# Patient Record
Sex: Female | Born: 1970 | Race: White | Hispanic: No | Marital: Married | Smoking: Former smoker
Health system: Southern US, Community
[De-identification: ages and names within clinical notes are randomized; demographics above are authoritative.]

## PROBLEM LIST (undated history)

## (undated) DIAGNOSIS — G43909 Migraine, unspecified, not intractable, without status migrainosus: Secondary | ICD-10-CM

## (undated) DIAGNOSIS — F419 Anxiety disorder, unspecified: Secondary | ICD-10-CM

## (undated) DIAGNOSIS — Z973 Presence of spectacles and contact lenses: Secondary | ICD-10-CM

## (undated) DIAGNOSIS — N941 Unspecified dyspareunia: Secondary | ICD-10-CM

## (undated) DIAGNOSIS — E8940 Asymptomatic postprocedural ovarian failure: Secondary | ICD-10-CM

## (undated) DIAGNOSIS — F32A Depression, unspecified: Secondary | ICD-10-CM

## (undated) DIAGNOSIS — N809 Endometriosis, unspecified: Secondary | ICD-10-CM

## (undated) DIAGNOSIS — L989 Disorder of the skin and subcutaneous tissue, unspecified: Secondary | ICD-10-CM

## (undated) DIAGNOSIS — IMO0002 Reserved for concepts with insufficient information to code with codable children: Secondary | ICD-10-CM

## (undated) DIAGNOSIS — N879 Dysplasia of cervix uteri, unspecified: Secondary | ICD-10-CM

## (undated) DIAGNOSIS — F329 Major depressive disorder, single episode, unspecified: Secondary | ICD-10-CM

## (undated) HISTORY — PX: CRYOTHERAPY: SHX1416

## (undated) HISTORY — DX: Asymptomatic postprocedural ovarian failure: E89.40

## (undated) HISTORY — DX: Unspecified dyspareunia: N94.10

## (undated) HISTORY — DX: Migraine, unspecified, not intractable, without status migrainosus: G43.909

## (undated) HISTORY — DX: Dysplasia of cervix uteri, unspecified: N87.9

## (undated) HISTORY — DX: Anxiety disorder, unspecified: F41.9

## (undated) HISTORY — DX: Disorder of the skin and subcutaneous tissue, unspecified: L98.9

## (undated) HISTORY — DX: Endometriosis, unspecified: N80.9

## (undated) HISTORY — DX: Depression, unspecified: F32.A

## (undated) HISTORY — DX: Reserved for concepts with insufficient information to code with codable children: IMO0002

---
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
## 2009-10-26 HISTORY — PX: VAGINAL HYSTERECTOMY: SUR661

## 2011-07-13 ENCOUNTER — Ambulatory Visit: Payer: Self-pay | Admitting: Obstetrics and Gynecology

## 2011-11-08 ENCOUNTER — Ambulatory Visit: Payer: Self-pay | Admitting: Obstetrics and Gynecology

## 2011-11-08 LAB — CBC
HCT: 41.7 % (ref 35.0–47.0)
HGB: 14 g/dL (ref 12.0–16.0)
MCH: 31.2 pg (ref 26.0–34.0)
MCV: 93 fL (ref 80–100)
Platelet: 198 10*3/uL (ref 150–440)
RBC: 4.48 10*6/uL (ref 3.80–5.20)
RDW: 12.8 % (ref 11.5–14.5)
WBC: 6.1 10*3/uL (ref 3.6–11.0)

## 2011-11-08 LAB — BASIC METABOLIC PANEL
BUN: 8 mg/dL (ref 7–18)
Calcium, Total: 8.6 mg/dL (ref 8.5–10.1)
Chloride: 107 mmol/L (ref 98–107)
Co2: 27 mmol/L (ref 21–32)
EGFR (African American): >60
Potassium: 3.9 mmol/L (ref 3.5–5.1)
Sodium: 139 mmol/L (ref 136–145)

## 2011-11-13 ENCOUNTER — Ambulatory Visit: Payer: Self-pay | Admitting: Obstetrics and Gynecology

## 2011-11-14 LAB — HEMOGLOBIN: HGB: 11.1 g/dL — ABNORMAL LOW (ref 12.0–16.0)

## 2011-11-16 LAB — PATHOLOGY REPORT

## 2013-01-10 ENCOUNTER — Emergency Department: Payer: Self-pay | Admitting: Emergency Medicine

## 2014-03-17 HISTORY — PX: CERVICAL DISCECTOMY: SHX98

## 2014-11-11 LAB — HM PAP SMEAR: HM PAP: NEGATIVE

## 2014-11-26 ENCOUNTER — Ambulatory Visit
Admit: 2014-11-26 | Disposition: A | Discharge status: Home / Self Care | Payer: Self-pay | Attending: Obstetrics and Gynecology | Admitting: Obstetrics and Gynecology

## 2014-11-26 LAB — HM MAMMOGRAPHY

## 2014-12-20 NOTE — Op Note (Signed)
PATIENT NAME:  Carmen Logan, Carmen Logan MR#:  [id number] DATE OF BIRTH:  September 11, 1970  DATE OF PROCEDURE:  11/13/2011  PREOPERATIVE DIAGNOSES:  1. Chronic pelvic pain.  2. Endometriosis.   POSTOPERATIVE DIAGNOSES:  1. Chronic pelvic pain.  2. Endometriosis.  3. Incidental cystotomy.  4. Normal cystoscopy with bilateral ureteral efflux.   OPERATIVE PROCEDURES:  1. Transvaginal hysterectomy with left salpingo-oophorectomy.  2. Repair of cystotomy.  3. Cystoscopy.   SURGEON: Alanda Slim. Gonzalo Waymire, M.D.   FIRST ASSISTANT: None.   ANESTHESIA: General endotracheal.   INDICATIONS: The patient is a 44 year old single, white multiparous female who presents for definitive surgical management of chronic pelvic pain secondary to endometriosis. The patient previously underwent right salpingo-oophorectomy for symptomatic pain. She now desires definitive treatment through a total vaginal hysterectomy with left salpingo-oophorectomy.  FINDINGS AT SURGERY: Grossly normal uterus and left tube and ovary. There was an incidental cystotomy noted on entry into the anterior cul-de-sac. Postoperative cystoscopy did reveal normal ureteral efflux with the ureteral orifices being functional.   DESCRIPTION OF PROCEDURE: The patient was brought to the Operating Room where she was placed in the supine position and general endotracheal anesthesia was induced without difficulty. She was placed in the dorsal lithotomy position using candy-cane stirrups. A Betadine perineal and intravaginal prep and drape was performed in the standard fashion. A Foley catheter was placed and the bladder was draining clear yellow urine. A weighted speculum was placed into the vagina and a double-tooth tenaculum was placed on the anterior lip of the cervix. Posterior colpotomy was made with Mayo scissors. Uterosacral ligaments were clamped, cut, and stick tied using 0 Vicryl. The cervix was circumscribed and the bladder and vaginal mucosa were  dissected off the lower uterine segment through sharp and blunt dissection. Further cardinal broad ligament complexes were clamped, cut, and stick tied. Upon making attempted entry into the anterior cul-de-sac, an incidental cystotomy was made. This was noted and tagged with Vicryl suture until after the hysterectomy in order to facilitate repair. The anterior cul-de-sac was entered. The remaining cardinal broad ligament complexes were clamped, cut, and stick tied up to the level of the utero-ovarian ligaments which were eventually then clamped, cut, and stick tied. The uterus was removed from the operative field. The right tube and ovary were then isolated with a Babcock clamp. A curved Haney clamp was used to crossclamp the infundibulopelvic ligament and the tube and ovary were then excised sharply. A free tie was placed on this pedicle after which a stick tie of 0 Vicryl was placed. Next, the posterior cuff was run using a 0 chromic suture in a running baseball stitch manner. This was done from uterosacral ligament posteriorly to the contralateral uterosacral ligament. The pedicles were noted to be hemostatic. Attention was then turned to the cystotomy. Allis clamps were placed on the bladder mucosa margins. The cystotomy was closed in two layers using a simple running stitch of 3-0 Vicryl. The first stitch incorporated the bladder mucosa and muscularis. The second layer of closure included an imbricating layer involving the muscularis of the bladder. The bladder was then filled with 200 mL of lactate Ringer's to verify integrity of the repair. No leakage was noted. Finally, the peritoneum was reapproximated using 0 Vicryl suture in a pursestring manner. This was followed by closure of the vagina with simple interrupted sutures of 2-0 chromic. Cystoscopy was then performed after Indigo carmine had been given intravenously. Cystoscopy did reveal the ureters to be patent with efflux of indigo  carmine from each  orifice. The repair site in the the bladder was noted and appeared  intact. No abnormalities were appreciated on cystoscopy. At this point, the bladder was drained with replacement of the Foley catheter, and the patient was then awakened, extubated, and taken to the Recovery Room in satisfactory condition. Estimated blood loss was 100 mL. There were no complications. All instruments, needle, and sponge counts were verified as correct.  ____________________________ Alanda Slim. Zahra Peffley, MD mad:slb D: 11/14/2011 13:13:00 ET     T: 11/14/2011 13:31:25 ET       JOB#: [id number] cc: Hassell Done A. Jacory Kamel, MD, <Dictator> Alanda Slim Hendy Brindle MD ELECTRONICALLY SIGNED 11/18/2011 11:44

## 2014-12-20 NOTE — H&P (Signed)
PATIENT NAME:  Carmen Logan, Carmen Logan MR#:  [id number] DATE OF BIRTH:  [birth year]  DATE OF ADMISSION:  11/13/2011  PREOPERATIVE DIAGNOSIS: Symptomatic endometriosis (chronic pelvic pain, midline and left lower quadrant).   HISTORY OF PRESENT ILLNESS: Chaunda Vandergriff is a 44 year old married white female, para 2-0-0-2, last menstrual period uncertain, using Provera 3 mg continuously for treatment of symptomatic endometriosis, presents now for definitive surgery. The patient previously has had laparoscopic RSO for pelvic pain. The patient is desiring definitive treatment at this time for her endometriosis. Major symptoms of endometriosis include chronic deep thrust dyspareunia to a point where she can no longer have relations with her spouse. She also has severe dysmenorrhea with perimenstrual low backache. Laparoscopy did verify endometriosis back in 1994.   PAST MEDICAL HISTORY:  1. Endometriosis.  2. Cervical dysplasia.   PAST SURGICAL HISTORY:  1. Cryosurgery.  2. BTL. 3. Laparoscopic RSO.   PAST OB HISTORY: Para 2-0-0-2. SVD x2 with largest baby being 6 pounds, 9 ounces.   PAST GYN HISTORY: Menarche age 67. Cycles typically every 28 days although more recently prior to Provera therapy they have been every 24 to 28 days. Duration of flow is 5 to 7 days of moderate degree. She typically has midline menstrual cramps, although she does have occasional perimenstrual low backache with radiation into the buttocks and groin.   FAMILY HISTORY: Negative for cancer of the colon, ovary, or breast. There is no known history of endometriosis in her family. There is diabetes mellitus on maternal side. The patient does not know paternal family history.   SOCIAL HISTORY: The patient smokes one-third of a pack of cigarettes a day. She denies alcohol use or drug use. 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   REVIEW OF SYSTEMS: The patient denies recent illness. She denies history of reactive airway disease. She denies  history of coagulopathy.  PHYSICAL EXAMINATION:  VITAL SIGNS: Height 5 feet 2 inches, weight 117, blood pressure 111/75, heart rate 86, BMI 21.   HEENT: Oropharynx clear.   NECK: Supple. No thyromegaly or adenopathy.   LUNGS: Clear.   HEART: Regular rate and rhythm without murmur.   ABDOMEN: Soft, flat, and nontender. Laparoscopy incisions in the umbilical region, right lower quadrant, and suprapubic region are well healed. There is no organomegaly.   PELVIC: External genitalia normal. BUS normal. Vagina has good estrogen effect. Cervix is parous and without lesions. There is 2 out of 4 cervical motion tenderness. Uterus is midplane and of normal size and shape. It is 1 out of 4 tender. Adnexa are nontender and nonpalpable.   RECTAL: Normal sphincter tone. No rectal masses. There was no obvious posterior uterine abnormality.   EXTREMITIES: Without clubbing, cyanosis, or edema.   IMPRESSION: Symptomatic endometriosis.    PLAN: TVH, LSO. Date of surgery is 11/13/2011.   CONSENT NOTE: Carmen Logan is to undergo transvaginal hysterectomy with left salpingo-oophorectomy for symptomatic endometriosis. She understands that this procedure will make her surgically menopausal. She is accepting of all risks which include, but are not limited to, bleeding, infection, pelvic organ injury with need for repair, blood clot disorders, anesthesia risks, and death. The patient does understand that she will likely need postoperative ERT therapy to offset vasomotor symptoms and help prevent osteoporosis. She is understanding of this and accepts. She is ready and willing to proceed with surgery. All questions are answered. Informed consent is given.  ____________________________ Alanda Slim. Sully Manzi, MD mad:drc D: 11/08/2011 16:53:25 ET T: 11/08/2011 17:12:00 ET JOB#: [id number]  cc:  Nikolaus Pienta A. Yocheved Depner, MD, <Dictator> Alanda Slim Dianelly Ferran MD ELECTRONICALLY SIGNED 11/09/2011 13:28

## 2015-05-26 ENCOUNTER — Telehealth: Payer: Self-pay | Admitting: Obstetrics and Gynecology

## 2015-05-26 NOTE — Telephone Encounter (Signed)
Pt called and she stopped taking her covarayx because she was having really bad migraines and headache and it has been a couple of weeks and they have subsided and she wanted to know if there was another medication that she can try for her hormone replacement. She did start taking OTC black cohosh and [place] wort as soon as she stopped taking the covarayx. Pt did say she was trying to send message thru my chart but she was unable to compose a message to [facility].

## 2015-05-26 NOTE — Telephone Encounter (Signed)
Pt aware she will need to make an appt and discuss with mad. Offered to make her an appt- she declines at this time.

## 2015-06-28 ENCOUNTER — Other Ambulatory Visit: Payer: Self-pay | Admitting: Obstetrics and Gynecology

## 2015-08-04 ENCOUNTER — Ambulatory Visit (INDEPENDENT_AMBULATORY_CARE_PROVIDER_SITE_OTHER): Payer: 59 | Admitting: Obstetrics and Gynecology

## 2015-08-04 ENCOUNTER — Encounter: Payer: Self-pay | Admitting: Obstetrics and Gynecology

## 2015-08-04 VITALS — BP 122/74 | HR 84 | Ht 62.0 in | Wt 119.6 lb

## 2015-08-04 DIAGNOSIS — N958 Other specified menopausal and perimenopausal disorders: Secondary | ICD-10-CM | POA: Diagnosis not present

## 2015-08-04 DIAGNOSIS — Z8669 Personal history of other diseases of the nervous system and sense organs: Secondary | ICD-10-CM | POA: Diagnosis not present

## 2015-08-04 DIAGNOSIS — E8940 Asymptomatic postprocedural ovarian failure: Secondary | ICD-10-CM | POA: Insufficient documentation

## 2015-08-04 DIAGNOSIS — Z8742 Personal history of other diseases of the female genital tract: Secondary | ICD-10-CM | POA: Diagnosis not present

## 2015-08-04 DIAGNOSIS — IMO0002 Reserved for concepts with insufficient information to code with codable children: Secondary | ICD-10-CM | POA: Insufficient documentation

## 2015-08-04 DIAGNOSIS — Z90710 Acquired absence of both cervix and uterus: Secondary | ICD-10-CM | POA: Diagnosis not present

## 2015-08-04 MED ORDER — ESTRADIOL 1 MG PO TABS: 1.0000 mg | ORAL_TABLET | Freq: Every day | ORAL | Status: DC

## 2015-08-04 NOTE — Progress Notes (Signed)
Patient ID: Carmen Logan, female   DOB: 1971-01-17, 44 y.o.   MRN: [id number]  6 month f/u hrt  d/c estratest- 2 months ago  causes migrianes Wants back on estradiol if possible  Chief complaint: 1.  Surgical menopause. 2.  Vasomotor symptoms. 3.  Migraine headaches.  Patient has been on [place] (esterified estrogen/testosterone) for vasomotor symptom relief.  She had to discontinue the medication because of severe migraines, not relieved with her migraine medications.  Prior dosing with Estrates tAlternative do not give her the headaches.  However, the patient's insurance formulary will not Provide this medication. Previously, the patient was on estradiol with fair control of vasomotor symptoms; she would like to go back on estradiol.  She is also using Estrace cream intravaginally.  We did discuss the possibility of going to Estring in order to provide benefit for both oral estrogen therapy as well as intravaginal benefits.  She will consider this when she returns for her annual physical in 3 months.  ASSESSMENT: 1.  Migraine headache exacerbation secondary to Covaryx 2.  Surgical menopause, symptomatic.  PLAN: 1.  Estradiol 1 mg daily 2.  Continue Estrace cream intravaginally twice weekly. 3.  Return in 3 months for gynecologic physical. 4.  Patient is to consider Estring as an alternative for her ERT therapy and vaginal estrogen therapy.  A total of 15 minutes were spent face-to-face with the patient during this encounter and over half of that time dealt with counseling and coordination of care.  Brayton Mars, MD Note: This dictation was prepared with Dragon dictation along with smaller phrase technology. Any transcriptional errors that result from this process are unintentional.

## 2015-08-04 NOTE — Patient Instructions (Signed)
1.  Start estradiol 1 mg a day. 2.  Continue Estrace cream intravaginally biweekly. 3.  Return in March 2016 for annual exam

## 2015-10-21 ENCOUNTER — Other Ambulatory Visit: Payer: Self-pay | Admitting: Obstetrics and Gynecology

## 2015-11-16 ENCOUNTER — Encounter: Payer: Self-pay | Admitting: Obstetrics and Gynecology

## 2015-11-16 ENCOUNTER — Ambulatory Visit (INDEPENDENT_AMBULATORY_CARE_PROVIDER_SITE_OTHER): Payer: Managed Care, Other (non HMO) | Admitting: Obstetrics and Gynecology

## 2015-11-16 VITALS — BP 120/70 | HR 74 | Ht 62.0 in | Wt 127.5 lb

## 2015-11-16 DIAGNOSIS — Z8669 Personal history of other diseases of the nervous system and sense organs: Secondary | ICD-10-CM

## 2015-11-16 DIAGNOSIS — E8940 Asymptomatic postprocedural ovarian failure: Secondary | ICD-10-CM

## 2015-11-16 DIAGNOSIS — Z90710 Acquired absence of both cervix and uterus: Secondary | ICD-10-CM

## 2015-11-16 DIAGNOSIS — N958 Other specified menopausal and perimenopausal disorders: Secondary | ICD-10-CM

## 2015-11-16 DIAGNOSIS — R6882 Decreased libido: Secondary | ICD-10-CM | POA: Diagnosis not present

## 2015-11-16 DIAGNOSIS — Z01419 Encounter for gynecological examination (general) (routine) without abnormal findings: Secondary | ICD-10-CM

## 2015-11-16 MED ORDER — ESTRADIOL 1 MG PO TABS: 1.0000 mg | ORAL_TABLET | Freq: Every day | ORAL | Status: DC

## 2015-11-16 MED ORDER — ESTRADIOL 0.1 MG/GM VA CREA: 0.0500 | TOPICAL_CREAM | Freq: Every day | VAGINAL | Status: DC

## 2015-11-16 NOTE — Patient Instructions (Signed)
1. No Pap smear. 2. Mammogram ordered 3. Estradiol 1 mg daily refills 4. Estrace cream twice a week intravaginal recommended 5. Trial of testosterone cream in HRT cream to be applied topically to the mons/clitoris every other day or twice weekly 6. Continue with calcium supplementation 600 mg twice a day along with vitamin D 400 units twice a day 7. Return in 1 year

## 2015-11-16 NOTE — Addendum Note (Signed)
Addended by: Elouise Munroe on: 11/16/2015 09:56 AM   Modules accepted: Orders

## 2015-11-16 NOTE — Progress Notes (Signed)
Patient ID: Carmen Logan, female   DOB: Feb 23, 1971, 45 y.o.   MRN: [id number] ANNUAL PREVENTATIVE CARE GYN  ENCOUNTER NOTE  Subjective:       Carmen Logan is a 45 y.o. G34P1002 female here for a routine annual gynecologic exam.  Current complaints: 1. Low Libido 2. Dyspareunia    45 y/o Female who is surgically menopausal c/o low libido and dyspareunia secondary to vaginal dryness. Pt is currently taking Estradiol Tablets 19m daily and using Vaginal Estradiol Cream 0.1 mg weekly. Pt has had a trial of Estratest but d/c due to increase in migraines. Pt states she experiences the occasional hot flash but otherwise denies vasomotor symptoms.    Gynecologic History No LMP recorded. Patient has had a hysterectomy. Contraception: status post hysterectomy Last Pap: 10/2014 neg-neg. Results were: normal Last mammogram: 10/2014 birad. Results were: normal Currently on HRT. Estradiol Tablets and Cream  No history of osteoporosis or fracture, taking Vitamin D and Calcium Supplementation   Obstetric History OB History  Gravida Para Term Preterm AB SAB TAB Ectopic Multiple Living  2 1 1       2     # Outcome Date GA Lbr Len/2nd Weight Sex Delivery Anes PTL Lv  2 Gravida 1999   6 lb 14.4 oz (3.13 kg) F Vag-Spont   Y  1 Term 1995   6 lb 4.8 oz (2.858 kg) F Vag-Spont   Y      Past Medical History  Diagnosis Date  . HPV test positive   . Surgical menopause   . Migraine   . Endometriosis   . Dyspareunia, female   . Facial skin lesion   . Cervical dysplasia     Past Surgical History  Procedure Laterality Date  . Vaginal hysterectomy      tvh lso  . Cervical discectomy    . Cryotherapy      cervix    Current Outpatient Prescriptions on File Prior to Visit  Medication Sig Dispense Refill  . calcium-vitamin D (OSCAL WITH D) 250-125 MG-UNIT tablet Take 1 tablet by mouth daily.    .Marland Kitchenestradiol (ESTRACE VAGINAL) 0.1 MG/GM vaginal cream Place 1 Applicatorful vaginally at bedtime.    .Marland Kitchen estradiol (ESTRACE) 1 MG tablet Take 1 tablet (1 mg total) by mouth daily. 90 tablet 3  . SUMAtriptan (IMITREX) 50 MG tablet Take 1 tablet by mouth at  onset of headache. May  repeat every 2 hours. No  more than 4 tablets daily. 13 tablet 0   No current facility-administered medications on file prior to visit.    No Known Allergies  Social History   Social History  . Marital Status: Married    Spouse Name: N/A  . Number of Children: N/A  . Years of Education: N/A   Occupational History  . Not on file.   Social History Main Topics  . Smoking status: Former SResearch scientist (life sciences) . Smokeless tobacco: Not on file  . Alcohol Use: Yes     Comment: occas  . Drug Use: No  . Sexual Activity: Yes    Birth Control/ Protection: Surgical   Other Topics Concern  . Not on file   Social History Narrative    Family History  Problem Relation Age of Onset  . Heart disease Mother   . Hypertension Mother   . Heart disease Father   . Cancer Neg Hx   . Diabetes Maternal Aunt   . Diabetes Maternal Grandmother     The following  portions of the patient's history were reviewed and updated as appropriate: allergies, current medications, past family history, past medical history, past social history, past surgical history and problem list.  Review of Systems ROS Review of Systems - General ROS:  negative for - chills, fatigue, fever,  night sweats, weight gain or weight loss Psychological ROS: positive for decreased libido negative for - anxiety, depression, mood swings, physical abuse or sexual abuse Ophthalmic ROS: negative for - blurry vision, eye pain or loss of vision ENT ROS: negative for - headaches, hearing change, visual changes or vocal changes Allergy and Immunology ROS: negative for - hives, itchy/watery eyes or seasonal allergies Hematological and Lymphatic ROS: negative for - bleeding problems, bruising, swollen lymph nodes or weight loss Endocrine ROS: positive for hot flashes (mild)   negative for - galactorrhea, hair pattern changes, malaise/lethargy, mood swings, palpitations, polydipsia/polyuria, skin changes, temperature intolerance or unexpected weight changes Breast ROS: negative for - new or changing breast lumps or nipple discharge Respiratory ROS: negative for - cough or shortness of breath Cardiovascular ROS: negative for - chest pain, irregular heartbeat, palpitations or shortness of breath Gastrointestinal ROS: no abdominal pain, change in bowel habits, or black or bloody stools Genito-Urinary ROS: positive for dryness and dyspanunia no dysuria, trouble voiding, or hematuria Musculoskeletal ROS: negative for - joint pain or joint stiffness Neurological ROS: negative for - bowel and bladder control changes Dermatological ROS: negative for rash and skin lesion changes   Objective:   BP 120/70 mmHg  Pulse 74  Ht 5' 2"  (1.575 m)  Wt 127 lb 8 oz (57.834 kg)  BMI 23.31 kg/m2 CONSTITUTIONAL: Well-developed, well-nourished female in no acute distress.  PSYCHIATRIC: Normal mood and affect. Normal behavior. Normal judgment and thought content. [place]: Alert and oriented to person, place, and time. Normal muscle tone coordination. No cranial nerve deficit noted. HENT:  Normocephalic, atraumatic, External right and left ear normal. Oropharynx is clear and moist EYES: Conjunctivae and EOM are normal. Pupils are equal, round, and reactive to light. No scleral icterus.  NECK: Normal range of motion, supple, no masses.  Normal thyroid.  SKIN: Skin is warm and dry. No rash noted. Not diaphoretic. No erythema. No pallor. CARDIOVASCULAR: Normal heart rate noted, regular rhythm, no murmur. RESPIRATORY: Clear to auscultation bilaterally. Effort and breath sounds normal, no problems with respiration noted. BREASTS: Symmetric in size. No masses, skin changes, nipple drainage, or lymphadenopathy. ABDOMEN: Soft, normal bowel sounds, no distention noted.  No tenderness, rebound or  guarding.  BLADDER: Normal PELVIC:  External Genitalia: Normal  BUS: Normal  Vagina: Normal With good estrogen effect; good support  Cervix: Surgically Absent  Uterus: Surgically Absent   Adnexa: Surgically Absent   RV: External Exam NormaI, No Rectal Masses and Normal Sphincter tone  MUSCULOSKELETAL: Normal range of motion. No tenderness.  No cyanosis, clubbing, or edema.  2+ distal pulses. LYMPHATIC: No Axillary, Supraclavicular, or Inguinal Adenopathy.    Assessment:   Annual gynecologic examination 45 y.o. Contraception: status post hysterectomy Normal BMI  History of endometriosis Surgical menopause  1. Low Libido  2. Dyspareunia ; chronic  Plan:  Pap: Not needed Mammogram: Ordered Stool Guaiac Testing:  Not Indicated Labs: vit d tsh a1c fbs lipid, Lipid 1, FBS, TSH, Hemoglobin A1C and Vit D Level""- Routine preventative health maintenance measures emphasized: Exercise/Diet/Weight control, Tobacco Warnings and Alcohol/Substance use risks   1. Low Libido  Ordered Testosterone Cream 4%. Patient instructed to apply externally to mons every other day to twice a  week  2. Dyspareunia  Increase Estrace cream from once a week to twice a week    Return to [place], [place] Luz Lex, PA-S Brayton Mars, MD   I have seen, interviewed, and examined the patient in conjunction with the [facility].A. student and affirm the diagnosis and management plan. Jennyfer Nickolson A. Daltin Crist, MD, FACOG   Note: This dictation was prepared with Dragon dictation along with smaller phrase technology. Any transcriptional errors that result from this process are unintentional.

## 2015-11-23 ENCOUNTER — Other Ambulatory Visit: Payer: Managed Care, Other (non HMO)

## 2015-11-24 LAB — LIPID PANEL
CHOL/HDL RATIO: 2.2 ratio (ref 0.0–4.4)
CHOLESTEROL TOTAL: 166 mg/dL (ref 100–199)
HDL: 76 mg/dL (ref >39)
LDL CALC: 69 mg/dL (ref 0–99)
Triglycerides: 103 mg/dL (ref 0–149)
VLDL CHOLESTEROL CAL: 21 mg/dL (ref 5–40)

## 2015-11-24 LAB — HEMOGLOBIN A1C
Est. average glucose Bld gHb Est-mCnc: 108 mg/dL
Hgb A1c MFr Bld: 5.4 % (ref 4.8–5.6)

## 2015-11-24 LAB — VITAMIN D 25 HYDROXY (VIT D DEFICIENCY, FRACTURES): Vit D, 25-Hydroxy: 69.7 ng/mL (ref 30.0–100.0)

## 2015-11-24 LAB — TSH: TSH: 5.95 u[IU]/mL — AB (ref 0.450–4.500)

## 2015-11-24 LAB — GLUCOSE, RANDOM: GLUCOSE: 86 mg/dL (ref 65–99)

## 2015-11-25 ENCOUNTER — Telehealth: Payer: Self-pay

## 2015-11-25 DIAGNOSIS — R7989 Other specified abnormal findings of blood chemistry: Secondary | ICD-10-CM

## 2015-11-25 NOTE — Telephone Encounter (Signed)
Pt aware. Labs ordered. Pt on lab schedule for May 1.

## 2015-11-25 NOTE — Telephone Encounter (Signed)
-----   Message from Brayton Mars, MD sent at 11/24/2015  7:41 AM EDT ----- Please notify - Abnormal Labs TSH and free T4 in 1 month

## 2015-12-16 ENCOUNTER — Ambulatory Visit
Admission: RE | Admit: 2015-12-16 | Discharge: 2015-12-16 | Disposition: A | Discharge status: Home / Self Care | Payer: Managed Care, Other (non HMO) | Source: Ambulatory Visit | Attending: Obstetrics and Gynecology | Admitting: Obstetrics and Gynecology

## 2015-12-16 DIAGNOSIS — Z01419 Encounter for gynecological examination (general) (routine) without abnormal findings: Secondary | ICD-10-CM

## 2015-12-16 DIAGNOSIS — Z1231 Encounter for screening mammogram for malignant neoplasm of breast: Secondary | ICD-10-CM | POA: Diagnosis present

## 2015-12-27 ENCOUNTER — Other Ambulatory Visit: Payer: Managed Care, Other (non HMO)

## 2015-12-28 ENCOUNTER — Other Ambulatory Visit: Payer: Managed Care, Other (non HMO)

## 2015-12-28 DIAGNOSIS — R7989 Other specified abnormal findings of blood chemistry: Secondary | ICD-10-CM

## 2015-12-29 LAB — TSH: TSH: 3.52 u[IU]/mL (ref 0.450–4.500)

## 2015-12-29 LAB — T4, FREE: FREE T4: 1.28 ng/dL (ref 0.82–1.77)

## 2016-01-01 ENCOUNTER — Other Ambulatory Visit: Payer: Self-pay | Admitting: Obstetrics and Gynecology

## 2016-03-28 ENCOUNTER — Other Ambulatory Visit: Payer: Self-pay | Admitting: Obstetrics and Gynecology

## 2016-03-29 MED ORDER — SUMATRIPTAN SUCCINATE 50 MG PO TABS: 50.0000 mg | ORAL_TABLET | ORAL | 0 refills | Status: DC | PRN

## 2016-07-31 IMAGING — MG MM DIGITAL SCREENING BILAT W/ CAD
5 series · 5 of 5 positions shown · non-contrast
Comparison: Previous exam(s).

CLINICAL DATA: Screening.

EXAM:
DIGITAL SCREENING BILATERAL MAMMOGRAM WITH CAD

[L MLO (1 of 2)]
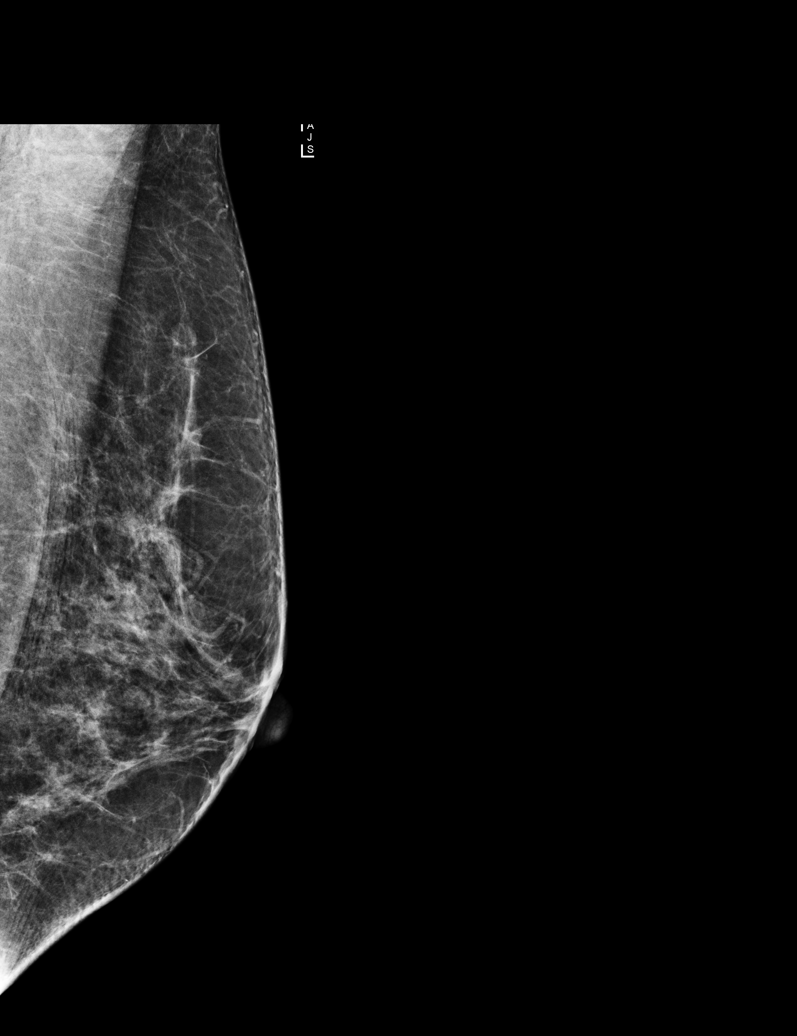

[R MLO]
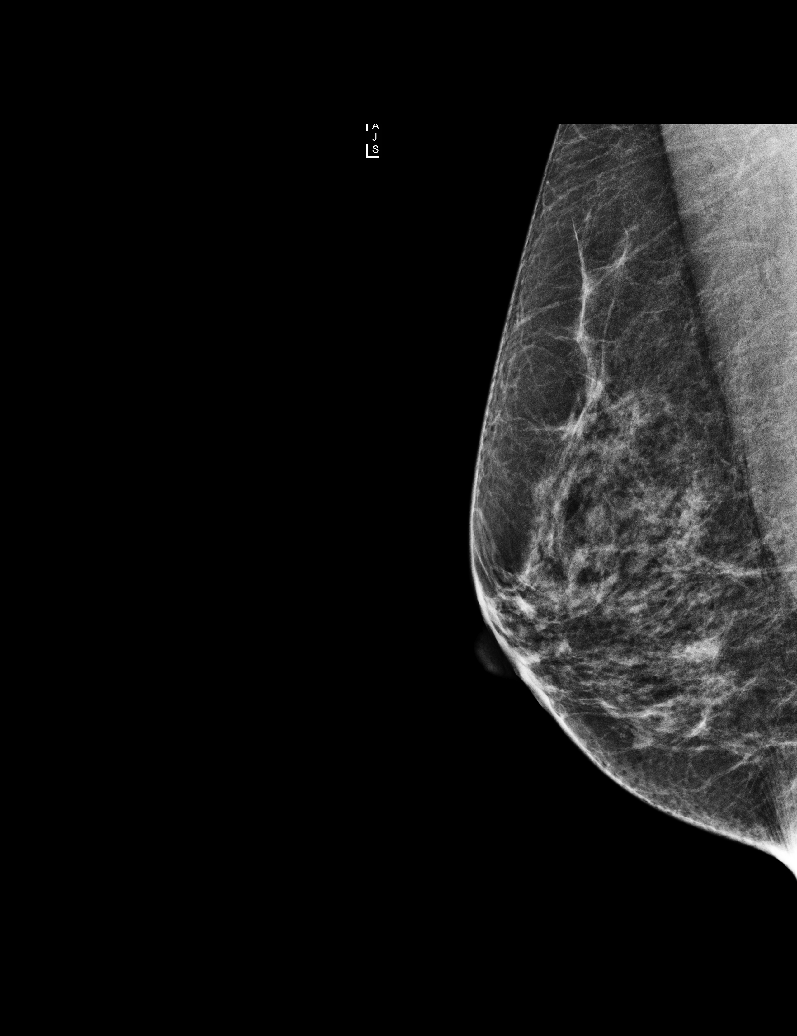

[L CC]
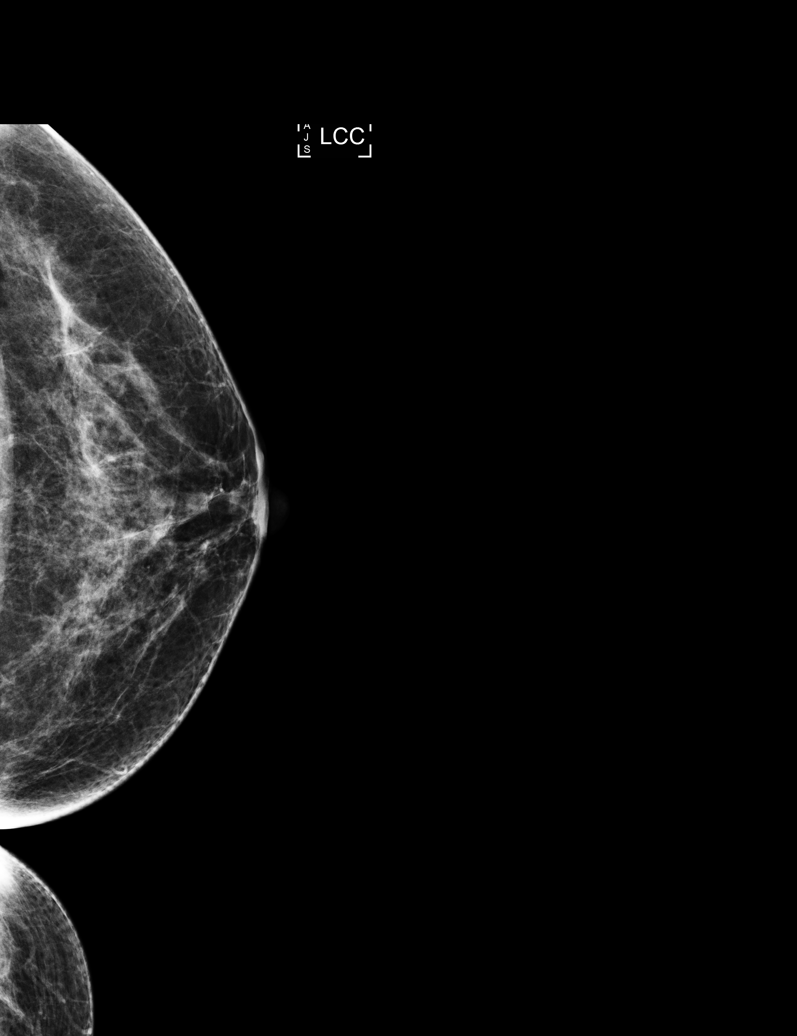

[L MLO (2 of 2)]
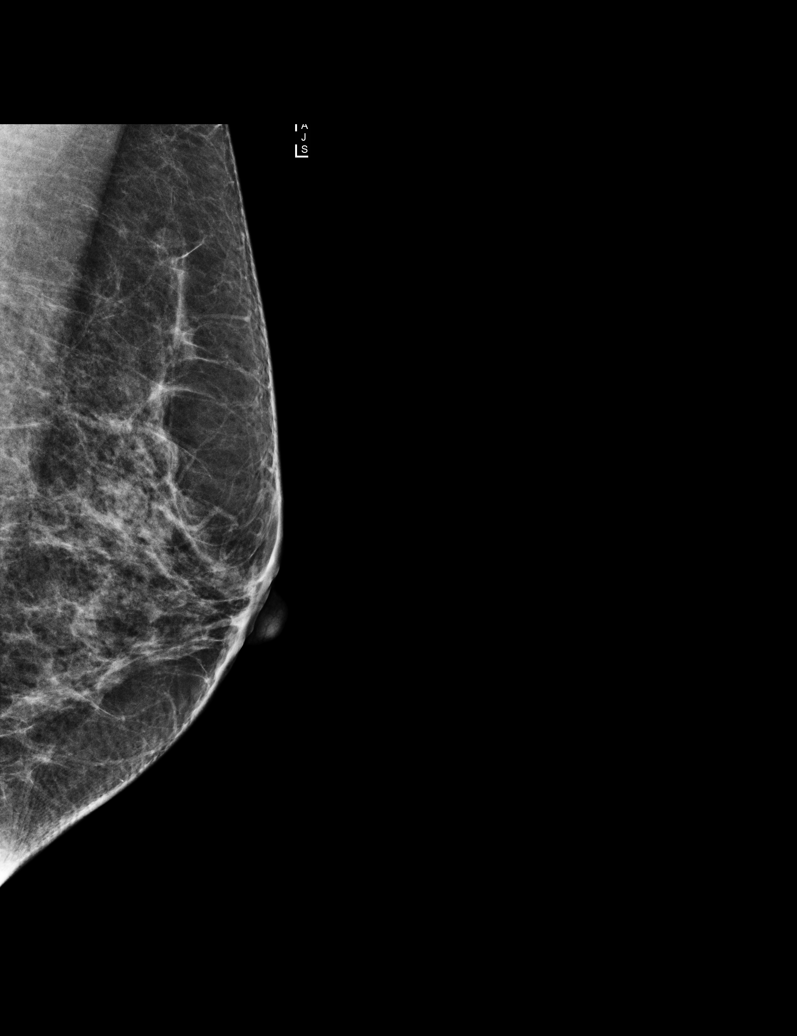

[R CC]
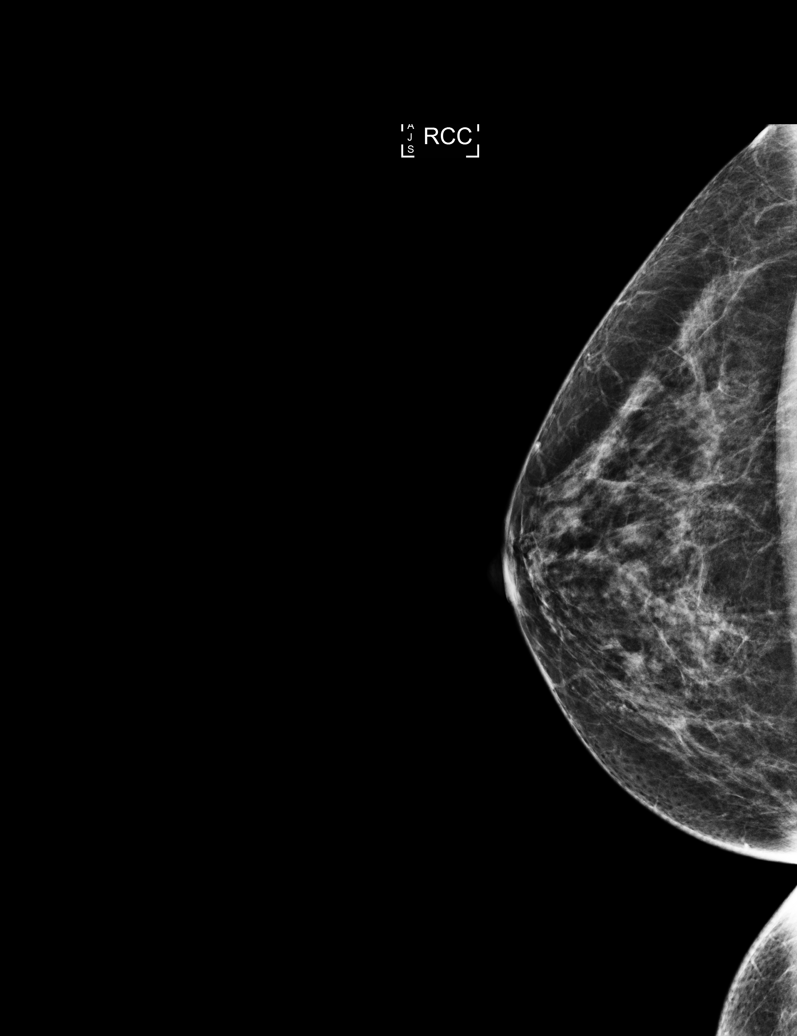

[5 of 5 positions shown; findings below may reference images not displayed]

ACR Breast Density Category c: The breast tissue is heterogeneously
dense, which may obscure small masses.
FINDINGS: There are no findings suspicious for malignancy. Images were
processed with CAD.
IMPRESSION: No mammographic evidence of malignancy. A result letter of this
screening mammogram will be mailed directly to the patient.

RECOMMENDATION:
Screening mammogram in one year. (Code:YJ-2-FEZ)

BI-RADS CATEGORY  1: Negative.

## 2016-08-23 ENCOUNTER — Other Ambulatory Visit: Payer: Self-pay | Admitting: Obstetrics and Gynecology

## 2016-10-28 ENCOUNTER — Other Ambulatory Visit: Payer: Self-pay | Admitting: Obstetrics and Gynecology

## 2016-11-11 ENCOUNTER — Other Ambulatory Visit: Payer: Self-pay | Admitting: Obstetrics and Gynecology

## 2016-11-21 ENCOUNTER — Encounter: Payer: Managed Care, Other (non HMO) | Admitting: Obstetrics and Gynecology

## 2016-12-13 ENCOUNTER — Encounter: Payer: Managed Care, Other (non HMO) | Admitting: Obstetrics and Gynecology

## 2016-12-14 NOTE — Progress Notes (Deleted)
Patient ID: Carmen Logan, female   DOB: 31-Oct-1970, 46 y.o.   MRN: [id number] ANNUAL PREVENTATIVE CARE GYN  ENCOUNTER NOTE  Subjective:       Carmen Logan is a 46 y.o. G44P1002 female here for a routine annual gynecologic exam.  Current complaints: 1.     46 y/o Female who is surgically menopausal. Pt is currently taking Estradiol Tablets 78m daily and using Vaginal Estradiol Cream 0.1 mg weekly. Pt states she experiences the occasional hot flash but otherwise denies vasomotor symptoms.    Gynecologic History No LMP recorded. Patient has had a hysterectomy. Contraception: status post hysterectomy Last Pap: 10/2014 neg-neg. Results were: normal Last mammogram: 11/2015 birad 1. Results were: normal Currently on HRT. Estradiol Tablets and Cream  No history of osteoporosis or fracture, taking Vitamin D and Calcium Supplementation   Obstetric History OB History  Gravida Para Term Preterm AB Living  2 1 1     2   SAB TAB Ectopic Multiple Live Births          2    # Outcome Date GA Lbr Len/2nd Weight Sex Delivery Anes PTL Lv  2 Gravida 1999   6 lb 14.4 oz (3.13 kg) F Vag-Spont   LIV  1 Term 1995   6 lb 4.8 oz (2.858 kg) F Vag-Spont   LIV      Past Medical History:  Diagnosis Date  . Cervical dysplasia   . Dyspareunia, female   . Endometriosis   . Facial skin lesion   . HPV test positive   . Migraine   . Surgical menopause     Past Surgical History:  Procedure Laterality Date  . CERVICAL DISCECTOMY    . CRYOTHERAPY     cervix  . VAGINAL HYSTERECTOMY     tvh lso    Current Outpatient Prescriptions on File Prior to Visit  Medication Sig Dispense Refill  . calcium-vitamin D (OSCAL WITH D) 250-125 MG-UNIT tablet Take 1 tablet by mouth daily.    .Marland Kitchenestradiol (ESTRACE) 0.1 MG/GM vaginal cream INSERT 1/4 APPLICATORFUL  VAGINALLY AT BEDTIME 127.5 g 1  . estradiol (ESTRACE) 1 MG tablet TAKE 1 TABLET BY MOUTH  DAILY 90 tablet 0  . SUMAtriptan (IMITREX) 50 MG tablet TAKE 1  TABLET BY MOUTH  EVERY 2 HOURS AS NEEDED FOR MIGRAINE. MAY REPEAT IN 2  HOURS IF HEADACHE PERSISTS  OR REOCCURS. 13 tablet 2   No current facility-administered medications on file prior to visit.     No Known Allergies  Social History   Social History  . Marital status: Married    Spouse name: N/A  . Number of children: N/A  . Years of education: N/A   Occupational History  . Not on file.   Social History Main Topics  . Smoking status: Former SResearch scientist (life sciences) . Smokeless tobacco: Not on file  . Alcohol use Yes     Comment: occas  . Drug use: No  . Sexual activity: Yes    Birth control/ protection: Surgical   Other Topics Concern  . Not on file   Social History Narrative  . No narrative on file    Family History  Problem Relation Age of Onset  . Heart disease Mother   . Hypertension Mother   . Heart disease Father   . Cancer Neg Hx   . Diabetes Maternal Aunt   . Diabetes Maternal Grandmother     The following portions of the patient's history were reviewed and  updated as appropriate: allergies, current medications, past family history, past medical history, past social history, past surgical history and problem list.  Review of Systems ROS    Objective:   There were no vitals taken for this visit. CONSTITUTIONAL: Well-developed, well-nourished female in no acute distress.  PSYCHIATRIC: Normal mood and affect. Normal behavior. Normal judgment and thought content. [place]: Alert and oriented to person, place, and time. Normal muscle tone coordination. No cranial nerve deficit noted. HENT:  Normocephalic, atraumatic, External right and left ear normal. Oropharynx is clear and moist EYES: Conjunctivae and EOM are normal. Pupils are equal, round, and reactive to light. No scleral icterus.  NECK: Normal range of motion, supple, no masses.  Normal thyroid.  SKIN: Skin is warm and dry. No rash noted. Not diaphoretic. No erythema. No pallor. CARDIOVASCULAR: Normal heart  rate noted, regular rhythm, no murmur. RESPIRATORY: Clear to auscultation bilaterally. Effort and breath sounds normal, no problems with respiration noted. BREASTS: Symmetric in size. No masses, skin changes, nipple drainage, or lymphadenopathy. ABDOMEN: Soft, normal bowel sounds, no distention noted.  No tenderness, rebound or guarding.  BLADDER: Normal PELVIC:  External Genitalia: Normal  BUS: Normal  Vagina: Normal With good estrogen effect; good support  Cervix: Surgically Absent  Uterus: Surgically Absent   Adnexa: Surgically Absent   RV: External Exam NormaI, No Rectal Masses and Normal Sphincter tone  MUSCULOSKELETAL: Normal range of motion. No tenderness.  No cyanosis, clubbing, or edema.  2+ distal pulses. LYMPHATIC: No Axillary, Supraclavicular, or Inguinal Adenopathy.    Assessment:   Annual gynecologic examination 46 y.o. Contraception: status post hysterectomy Normal BMI  History of endometriosis Surgical menopause    Plan:  Pap: Due 2019 Mammogram: Ordered Stool Guaiac Testing:  Not Indicated Labs: Vit d, a1c, fbs, tsh. Lipid,  Routine preventative health maintenance measures emphasized: Exercise/Diet/Weight control, Tobacco Warnings and Alcohol/Substance use risks   1Increase Estrace cream from once a week to twice a week    Return to [place] have seen, interviewed, and examined the patient in conjunction with the [place].A. student and affirm the diagnosis and management plan. Martin A. DeFrancesco, MD, FACOG   Note: This dictation was prepared with Dragon dictation along with smaller phrase technology. Any transcriptional errors that result from this process are unintentional.

## 2016-12-18 ENCOUNTER — Encounter: Payer: Managed Care, Other (non HMO) | Admitting: Obstetrics and Gynecology

## 2016-12-25 NOTE — Progress Notes (Signed)
Patient ID: Carmen Logan, female   DOB: 06/18/1971, 46 y.o.   MRN: [id number] ANNUAL PREVENTATIVE CARE GYN  ENCOUNTER NOTE  Subjective:       Carmen Logan is a 46 y.o. G70P1002 female here for a routine annual gynecologic exam.  Current complaints: 1. none     46 y/o Female who is surgically menopausal. Pt is currently taking Estradiol Tablets 98m daily and using Vaginal Estradiol Cream 0.1 mg weekly. Pt states she experiences the occasional hot flash but otherwise denies vasomotor symptoms.    Gynecologic History No LMP recorded. Patient has had a hysterectomy. Contraception: status post hysterectomy Last Pap: 10/2014 neg-neg. Results were: normal Last mammogram: 11/2015 birad 1. Results were: normal Currently on HRT. Estradiol Tablets and Cream  No history of osteoporosis or fracture, taking Vitamin D and Calcium Supplementation   Obstetric History OB History  Gravida Para Term Preterm AB Living  2 1 1     2   SAB TAB Ectopic Multiple Live Births          2    # Outcome Date GA Lbr Len/2nd Weight Sex Delivery Anes PTL Lv  2 Gravida 1999   6 lb 14.4 oz (3.13 kg) F Vag-Spont   LIV  1 Term 1995   6 lb 4.8 oz (2.858 kg) F Vag-Spont   LIV      Past Medical History:  Diagnosis Date  . Cervical dysplasia   . Dyspareunia, female   . Endometriosis   . Facial skin lesion   . HPV test positive   . Migraine   . Surgical menopause     Past Surgical History:  Procedure Laterality Date  . CERVICAL DISCECTOMY    . CRYOTHERAPY     cervix  . VAGINAL HYSTERECTOMY     tvh lso    Current Outpatient Prescriptions on File Prior to Visit  Medication Sig Dispense Refill  . calcium-vitamin D (OSCAL WITH D) 250-125 MG-UNIT tablet Take 1 tablet by mouth daily.    .Marland Kitchenestradiol (ESTRACE) 0.1 MG/GM vaginal cream INSERT 1/4 APPLICATORFUL  VAGINALLY AT BEDTIME 127.5 g 1  . estradiol (ESTRACE) 1 MG tablet TAKE 1 TABLET BY MOUTH  DAILY 90 tablet 0  . SUMAtriptan (IMITREX) 50 MG tablet TAKE 1  TABLET BY MOUTH  EVERY 2 HOURS AS NEEDED FOR MIGRAINE. MAY REPEAT IN 2  HOURS IF HEADACHE PERSISTS  OR REOCCURS. 13 tablet 2   No current facility-administered medications on file prior to visit.     No Known Allergies  Social History   Social History  . Marital status: Married    Spouse name: N/A  . Number of children: N/A  . Years of education: N/A   Occupational History  . Not on file.   Social History Main Topics  . Smoking status: Former SResearch scientist (life sciences) . Smokeless tobacco: Not on file  . Alcohol use Yes     Comment: occas  . Drug use: No  . Sexual activity: Yes    Birth control/ protection: Surgical   Other Topics Concern  . Not on file   Social History Narrative  . No narrative on file    Family History  Problem Relation Age of Onset  . Heart disease Mother   . Hypertension Mother   . Heart disease Father   . Cancer Neg Hx   . Diabetes Maternal Aunt   . Diabetes Maternal Grandmother     The following portions of the patient's history were reviewed  and updated as appropriate: allergies, current medications, past family history, past medical history, past social history, past surgical history and problem list.  Review of Systems Review of Systems  Constitutional: Negative for chills, diaphoresis, fever, malaise/fatigue and weight loss.  HENT: Negative for congestion, hearing loss, sinus pain and sore throat.   Eyes: Negative for blurred vision.  Respiratory: Negative for cough and shortness of breath.   Cardiovascular: Negative for chest pain, palpitations and leg swelling.  Gastrointestinal: Positive for constipation. Negative for abdominal pain, blood in stool, diarrhea, nausea and vomiting.  Genitourinary: Negative for dysuria, frequency, hematuria and urgency.  Musculoskeletal: Negative for back pain, myalgias and neck pain.  Skin: Negative for itching and rash.  Neurological: Positive for headaches. Negative for dizziness, focal weakness, seizures, loss of  consciousness and weakness.      Objective:   BP 126/86   Pulse 76   Ht 5' 2"  (1.575 m)   Wt 143 lb 14.4 oz (65.3 kg)   BMI 26.32 kg/m  CONSTITUTIONAL: Well-developed, well-nourished female in no acute distress.  PSYCHIATRIC: Normal mood and affect. Normal behavior. Normal judgment and thought content. [place]: Alert and oriented to person, place, and time. Normal muscle tone coordination. No cranial nerve deficit noted. HENT:  Normocephalic, atraumatic, External right and left ear normal. Oropharynx is clear and moist EYES: Conjunctivae and EOM are normal. Pupils are equal, round, and reactive to light. No scleral icterus.  NECK: Normal range of motion, supple, no masses.  Normal thyroid.  SKIN: Skin is warm and dry. No rash noted. Not diaphoretic. No erythema. No pallor. CARDIOVASCULAR: Normal heart rate noted, regular rhythm, no murmur. RESPIRATORY: Clear to auscultation bilaterally. Effort and breath sounds normal, no problems with respiration noted. BREASTS: Symmetric in size. No masses, skin changes, nipple drainage, or lymphadenopathy. ABDOMEN: Soft, normal bowel sounds, no distention noted.  No tenderness, rebound or guarding.  BLADDER: Normal PELVIC:  External Genitalia: Normal  BUS: Normal  Vagina: Normal With good estrogen effect; good support  Cervix: Surgically Absent  Uterus: Surgically Absent   Adnexa: Surgically Absent   RV: External Exam NormaI, No Rectal Masses and Normal Sphincter tone  MUSCULOSKELETAL: Normal range of motion. No tenderness.  No cyanosis, clubbing, or edema.  2+ distal pulses. LYMPHATIC: No Axillary, Supraclavicular, or Inguinal Adenopathy.    Assessment:   Annual gynecologic examination 46 y.o. Contraception: status post hysterectomy (TVH) Normal BMI - 26 History of endometriosis, Asymptomatic Surgical menopause, Asymptomatic on ERT    Plan:  Pap: Due 2019 Mammogram: Ordered Stool Guaiac Testing:  Not Indicated Labs: Vit d,  a1c, fbs, tsh. Lipid,  Routine preventative health maintenance measures emphasized: Exercise/Diet/Weight control, Tobacco Warnings and Alcohol/Substance use risks  Estrace cream twice weekly Refill estradiol 1 mg daily Return to [place], CMA  Brayton Mars, MD  Note: This dictation was prepared with Dragon dictation along with smaller phrase technology. Any transcriptional errors that result from this process are unintentional.    I have seen, interviewed, and examined the patient in conjunction with the [place].A. student and affirm the diagnosis and management plan. Martin A. DeFrancesco, MD, FACOG   Note: This dictation was prepared with Dragon dictation along with smaller phrase technology. Any transcriptional errors that result from this process are unintentional.

## 2016-12-26 ENCOUNTER — Ambulatory Visit (INDEPENDENT_AMBULATORY_CARE_PROVIDER_SITE_OTHER): Payer: Managed Care, Other (non HMO) | Admitting: Obstetrics and Gynecology

## 2016-12-26 ENCOUNTER — Encounter: Payer: Self-pay | Admitting: Obstetrics and Gynecology

## 2016-12-26 VITALS — BP 126/86 | HR 76 | Ht 62.0 in | Wt 143.9 lb

## 2016-12-26 DIAGNOSIS — Z01419 Encounter for gynecological examination (general) (routine) without abnormal findings: Secondary | ICD-10-CM | POA: Diagnosis not present

## 2016-12-26 DIAGNOSIS — Z1231 Encounter for screening mammogram for malignant neoplasm of breast: Secondary | ICD-10-CM | POA: Diagnosis not present

## 2016-12-26 DIAGNOSIS — Z90710 Acquired absence of both cervix and uterus: Secondary | ICD-10-CM

## 2016-12-26 DIAGNOSIS — Z8619 Personal history of other infectious and parasitic diseases: Secondary | ICD-10-CM

## 2016-12-26 DIAGNOSIS — E8940 Asymptomatic postprocedural ovarian failure: Secondary | ICD-10-CM | POA: Diagnosis not present

## 2016-12-26 DIAGNOSIS — Z8669 Personal history of other diseases of the nervous system and sense organs: Secondary | ICD-10-CM

## 2016-12-26 DIAGNOSIS — Z8742 Personal history of other diseases of the female genital tract: Secondary | ICD-10-CM

## 2016-12-26 DIAGNOSIS — Z1239 Encounter for other screening for malignant neoplasm of breast: Secondary | ICD-10-CM

## 2016-12-26 NOTE — Patient Instructions (Signed)
1. No Pap smear until 2019 2. Mammogram ordered 3. Continue with healthy eating, exercise and control weight loss to get to a normal body mass index 4. Continue with calcium and vitamin D supplementation daily 5. Refill estradiol 1 mg a day 6. Refill Estrace cream 1/2 g intravaginal twice a week 7. Return in 1 year for annual exam   Health Maintenance for Postmenopausal Women Menopause is a normal process in which your reproductive ability comes to an end. This process happens gradually over a span of months to years, usually between the ages of 78 and 29. Menopause is complete when you have missed 12 consecutive menstrual periods. It is important to talk with your health care provider about some of the most common conditions that affect postmenopausal women, such as heart disease, cancer, and bone loss (osteoporosis). Adopting a healthy lifestyle and getting preventive care can help to promote your health and wellness. Those actions can also lower your chances of developing some of these common conditions. What should I know about menopause? During menopause, you may experience a number of symptoms, such as:  Moderate-to-severe hot flashes.  Night sweats.  Decrease in sex drive.  Mood swings.  Headaches.  Tiredness.  Irritability.  Memory problems.  Insomnia. Choosing to treat or not to treat menopausal changes is an individual decision that you make with your health care provider. What should I know about hormone replacement therapy and supplements? Hormone therapy products are effective for treating symptoms that are associated with menopause, such as hot flashes and night sweats. Hormone replacement carries certain risks, especially as you become older. If you are thinking about using estrogen or estrogen with progestin treatments, discuss the benefits and risks with your health care provider. What should I know about heart disease and stroke? Heart disease, heart attack, and  stroke become more likely as you age. This may be due, in part, to the hormonal changes that your body experiences during menopause. These can affect how your body processes dietary fats, triglycerides, and cholesterol. Heart attack and stroke are both medical emergencies. There are many things that you can do to help prevent heart disease and stroke:  Have your blood pressure checked at least every 1-2 years. High blood pressure causes heart disease and increases the risk of stroke.  If you are 25-52 years old, ask your health care provider if you should take aspirin to prevent a heart attack or a stroke.  Do not use any tobacco products, including cigarettes, chewing tobacco, or electronic cigarettes. If you need help quitting, ask your health care provider.  It is important to eat a healthy diet and maintain a healthy weight.  Be sure to include plenty of vegetables, fruits, low-fat dairy products, and lean protein.  Avoid eating foods that are high in solid fats, added sugars, or salt (sodium).  Get regular exercise. This is one of the most important things that you can do for your health.  Try to exercise for at least 150 minutes each week. The type of exercise that you do should increase your heart rate and make you sweat. This is known as moderate-intensity exercise.  Try to do strengthening exercises at least twice each week. Do these in addition to the moderate-intensity exercise.  Know your numbers.Ask your health care provider to check your cholesterol and your blood glucose. Continue to have your blood tested as directed by your health care provider. What should I know about cancer screening? There are several types of cancer.  Take the following steps to reduce your risk and to catch any cancer development as early as possible. Breast Cancer  Practice breast self-awareness.  This means understanding how your breasts normally appear and feel.  It also means doing regular  breast self-exams. Let your health care provider know about any changes, no matter how small.  If you are 68 or older, have a clinician do a breast exam (clinical breast exam or CBE) every year. Depending on your age, family history, and medical history, it may be recommended that you also have a yearly breast X-ray (mammogram).  If you have a family history of breast cancer, talk with your health care provider about genetic screening.  If you are at high risk for breast cancer, talk with your health care provider about having an MRI and a mammogram every year.  Breast cancer (BRCA) gene test is recommended for women who have family members with BRCA-related cancers. Results of the assessment will determine the need for genetic counseling and BRCA1 and for BRCA2 testing. BRCA-related cancers include these types:  Breast. This occurs in males or females.  Ovarian.  Tubal. This may also be called fallopian tube cancer.  Cancer of the abdominal or pelvic lining (peritoneal cancer).  Prostate.  Pancreatic. Cervical, Uterine, and Ovarian Cancer  Your health care provider may recommend that you be screened regularly for cancer of the pelvic organs. These include your ovaries, uterus, and vagina. This screening involves a pelvic exam, which includes checking for microscopic changes to the surface of your cervix (Pap test).  For women ages 21-65, health care providers may recommend a pelvic exam and a Pap test every three years. For women ages 69-65, they may recommend the Pap test and pelvic exam, combined with testing for human papilloma virus (HPV), every five years. Some types of HPV increase your risk of cervical cancer. Testing for HPV may also be done on women of any age who have unclear Pap test results.  Other health care providers may not recommend any screening for nonpregnant women who are considered low risk for pelvic cancer and have no symptoms. Ask your health care provider if a  screening pelvic exam is right for you.  If you have had past treatment for cervical cancer or a condition that could lead to cancer, you need Pap tests and screening for cancer for at least 20 years after your treatment. If Pap tests have been discontinued for you, your risk factors (such as having a new sexual partner) need to be reassessed to determine if you should start having screenings again. Some women have medical problems that increase the chance of getting cervical cancer. In these cases, your health care provider may recommend that you have screening and Pap tests more often.  If you have a family history of uterine cancer or ovarian cancer, talk with your health care provider about genetic screening.  If you have vaginal bleeding after reaching menopause, tell your health care provider.  There are currently no reliable tests available to screen for ovarian cancer. Lung Cancer  Lung cancer screening is recommended for adults 69-42 years old who are at high risk for lung cancer because of a history of smoking. A yearly low-dose CT scan of the lungs is recommended if you:  Currently smoke.  Have a history of at least 30 pack-years of smoking and you currently smoke or have quit within the past 15 years. A pack-year is smoking an average of one pack of cigarettes  per day for one year. Yearly screening should:  Continue until it has been 15 years since you quit.  Stop if you develop a health problem that would prevent you from having lung cancer treatment. Colorectal Cancer  This type of cancer can be detected and can often be prevented.  Routine colorectal cancer screening usually begins at age 20 and continues through age 68.  If you have risk factors for colon cancer, your health care provider may recommend that you be screened at an earlier age.  If you have a family history of colorectal cancer, talk with your health care provider about genetic screening.  Your health care  provider may also recommend using home test kits to check for hidden blood in your stool.  A small camera at the end of a tube can be used to examine your colon directly (sigmoidoscopy or colonoscopy). This is done to check for the earliest forms of colorectal cancer.  Direct examination of the colon should be repeated every 5-10 years until age 1. However, if early forms of precancerous polyps or small growths are found or if you have a family history or genetic risk for colorectal cancer, you may need to be screened more often. Skin Cancer  Check your skin from head to toe regularly.  Monitor any moles. Be sure to tell your health care provider:  About any new moles or changes in moles, especially if there is a change in a mole's shape or color.  If you have a mole that is larger than the size of a pencil eraser.  If any of your family members has a history of skin cancer, especially at a young age, talk with your health care provider about genetic screening.  Always use sunscreen. Apply sunscreen liberally and repeatedly throughout the day.  Whenever you are outside, protect yourself by wearing long sleeves, pants, a wide-brimmed hat, and sunglasses. What should I know about osteoporosis? Osteoporosis is a condition in which bone destruction happens more quickly than new bone creation. After menopause, you may be at an increased risk for osteoporosis. To help prevent osteoporosis or the bone fractures that can happen because of osteoporosis, the following is recommended:  If you are 32-72 years old, get at least 1,000 mg of calcium and at least 600 mg of vitamin D per day.  If you are older than age 62 but younger than age 47, get at least 1,200 mg of calcium and at least 600 mg of vitamin D per day.  If you are older than age 38, get at least 1,200 mg of calcium and at least 800 mg of vitamin D per day. Smoking and excessive alcohol intake increase the risk of osteoporosis. Eat foods  that are rich in calcium and vitamin D, and do weight-bearing exercises several times each week as directed by your health care provider. What should I know about how menopause affects my mental health? Depression may occur at any age, but it is more common as you become older. Common symptoms of depression include:  Low or sad mood.  Changes in sleep patterns.  Changes in appetite or eating patterns.  Feeling an overall lack of motivation or enjoyment of activities that you previously enjoyed.  Frequent crying spells. Talk with your health care provider if you think that you are experiencing depression. What should I know about immunizations? It is important that you get and maintain your immunizations. These include:  Tetanus, diphtheria, and pertussis (Tdap) booster vaccine.  Influenza  every year before the flu season begins.  Pneumonia vaccine.  Shingles vaccine. Your health care provider may also recommend other immunizations. This information is not intended to replace advice given to you by your health care provider. Make sure you discuss any questions you have with your health care provider. Document Released: 10/06/2005 Document Revised: 03/03/2016 Document Reviewed: 05/18/2015 Elsevier Interactive Patient Education  2017 [employer].

## 2016-12-28 ENCOUNTER — Other Ambulatory Visit: Payer: Managed Care, Other (non HMO)

## 2017-01-01 ENCOUNTER — Other Ambulatory Visit: Payer: Managed Care, Other (non HMO)

## 2017-01-02 LAB — LIPID PANEL
CHOL/HDL RATIO: 3.1 ratio (ref 0.0–4.4)
CHOLESTEROL TOTAL: 183 mg/dL (ref 100–199)
HDL: 59 mg/dL (ref >39)
LDL CALC: 82 mg/dL (ref 0–99)
TRIGLYCERIDES: 211 mg/dL — AB (ref 0–149)
VLDL CHOLESTEROL CAL: 42 mg/dL — AB (ref 5–40)

## 2017-01-02 LAB — HEMOGLOBIN A1C
Est. average glucose Bld gHb Est-mCnc: 105 mg/dL
Hgb A1c MFr Bld: 5.3 % (ref 4.8–5.6)

## 2017-01-02 LAB — GLUCOSE, RANDOM: Glucose: 93 mg/dL (ref 65–99)

## 2017-01-02 LAB — VITAMIN D 25 HYDROXY (VIT D DEFICIENCY, FRACTURES): Vit D, 25-Hydroxy: 57.9 ng/mL (ref 30.0–100.0)

## 2017-01-02 LAB — TSH: TSH: 2.73 u[IU]/mL (ref 0.450–4.500)

## 2017-02-01 ENCOUNTER — Other Ambulatory Visit: Payer: Self-pay | Admitting: Obstetrics and Gynecology

## 2017-04-21 ENCOUNTER — Other Ambulatory Visit: Payer: Self-pay | Admitting: Obstetrics and Gynecology

## 2017-07-03 ENCOUNTER — Other Ambulatory Visit: Payer: Self-pay | Admitting: Obstetrics and Gynecology

## 2017-07-04 ENCOUNTER — Ambulatory Visit
Admission: RE | Admit: 2017-07-04 | Discharge: 2017-07-04 | Disposition: A | Discharge status: Home / Self Care | Payer: Managed Care, Other (non HMO) | Source: Ambulatory Visit | Attending: Obstetrics and Gynecology | Admitting: Obstetrics and Gynecology

## 2017-07-04 DIAGNOSIS — Z1239 Encounter for other screening for malignant neoplasm of breast: Secondary | ICD-10-CM

## 2017-07-04 DIAGNOSIS — Z1231 Encounter for screening mammogram for malignant neoplasm of breast: Secondary | ICD-10-CM | POA: Insufficient documentation

## 2017-07-12 ENCOUNTER — Encounter: Payer: Self-pay | Admitting: Obstetrics and Gynecology

## 2017-07-17 ENCOUNTER — Other Ambulatory Visit: Payer: Self-pay

## 2017-07-17 MED ORDER — BUTALBITAL-APAP-CAFF-COD 50-325-40-30 MG PO CAPS: 2.0000 | ORAL_CAPSULE | ORAL | 1 refills | Status: DC | PRN

## 2017-12-25 ENCOUNTER — Other Ambulatory Visit: Payer: Self-pay | Admitting: Obstetrics and Gynecology

## 2018-01-18 NOTE — Progress Notes (Signed)
Patient ID: Carmen Logan, female   DOB: 1970/09/07, 47 y.o.   MRN: [id number] ANNUAL PREVENTATIVE CARE GYN  ENCOUNTER NOTE  Subjective:       Carmen Logan is a 47 y.o. G39P1002 female here for a routine annual gynecologic exam.  Current complaints:  1. none     47 y/o Female who is surgically menopausal. Pt is currently taking Estradiol Tablets 74m daily and using Vaginal Estradiol Cream 0.1 mg weekly. Pt states she experiences the occasional hot flash but otherwise denies vasomotor symptoms.    Gynecologic History No LMP recorded. Patient has had a hysterectomy.  Status post TVH LSO Contraception: status post hysterectomy Last Pap: 10/2014 neg-neg. Results were: normal Last mammogram: 07/17/2017  birad 1. Results were: normal Currently on HRT. Estradiol Tablets and Cream  No history of osteoporosis or fracture, taking Vitamin D and Calcium Supplementation   Obstetric History OB History  Gravida Para Term Preterm AB Living  2 1 1     2   SAB TAB Ectopic Multiple Live Births          2    # Outcome Date GA Lbr Len/2nd Weight Sex Delivery Anes PTL Lv  2 Gravida 1999   6 lb 14.4 oz (3.13 kg) F Vag-Spont   LIV  1 Term 1995   6 lb 4.8 oz (2.858 kg) F Vag-Spont   LIV    Past Medical History:  Diagnosis Date  . Cervical dysplasia   . Dyspareunia, female   . Endometriosis   . Facial skin lesion   . HPV test positive   . Migraine   . Surgical menopause     Past Surgical History:  Procedure Laterality Date  . CERVICAL DISCECTOMY    . CRYOTHERAPY     cervix  . VAGINAL HYSTERECTOMY     tvh lso    Current Outpatient Medications on File Prior to Visit  Medication Sig Dispense Refill  . butalbital-acetaminophen-caffeine (FIORICET/CODEINE) 50-325-40-30 MG capsule Take 2 capsules by mouth every 4 (four) hours as needed for headache. No more than 6 qd 30 capsule 1  . calcium-vitamin D (OSCAL WITH D) 250-125 MG-UNIT tablet Take 1 tablet by mouth daily.    .Marland Kitchenestradiol (ESTRACE)  0.1 MG/GM vaginal cream INSERT 1/4 APPLICATORFUL (1 GRAM) VAGINALLY AT BEDTIME 127.5 g 3  . estradiol (ESTRACE) 1 MG tablet TAKE 1 TABLET BY MOUTH  DAILY 90 tablet 0  . SUMAtriptan (IMITREX) 50 MG tablet TAKE 1 TABLET BY MOUTH  EVERY 2 HOURS AS NEEDED FOR MIGRAINE. MAY REPEAT IN 2  HOURS IF HEADACHE PERSISTS  OR REOCCURS. 13 tablet 2   No current facility-administered medications on file prior to visit.     No Known Allergies  Social History   Socioeconomic History  . Marital status: Married    Spouse name: Not on file  . Number of children: Not on file  . Years of education: Not on file  . Highest education level: Not on file  Occupational History  . Not on file  Social Needs  . Financial resource strain: Not on file  . Food insecurity:    Worry: Not on file    Inability: Not on file  . Transportation needs:    Medical: Not on file    Non-medical: Not on file  Tobacco Use  . Smoking status: Former SResearch scientist (life sciences) . Smokeless tobacco: Never Used  Substance and Sexual Activity  . Alcohol use: Yes    Comment: occas  .  Drug use: No  . Sexual activity: Yes    Birth control/protection: Surgical  Lifestyle  . Physical activity:    Days per week: Not on file    Minutes per session: Not on file  . Stress: Not on file  Relationships  . Social connections:    Talks on phone: Not on file    Gets together: Not on file    Attends religious service: Not on file    Active member of club or organization: Not on file    Attends meetings of clubs or organizations: Not on file    Relationship status: Not on file  . Intimate partner violence:    Fear of current or ex partner: Not on file    Emotionally abused: Not on file    Physically abused: Not on file    Forced sexual activity: Not on file  Other Topics Concern  . Not on file  Social History Narrative  . Not on file    Family History  Problem Relation Age of Onset  . Heart disease Mother   . Hypertension Mother   . Heart  disease Father   . Diabetes Maternal Aunt   . Diabetes Maternal Grandmother   . Cancer Neg Hx     The following portions of the patient's history were reviewed and updated as appropriate: allergies, current medications, past family history, past medical history, past social history, past surgical history and problem list.  Review of Systems     Objective:   BP 127/90   Pulse 96   Ht 5' 2"  (1.575 m)   Wt 139 lb 9.6 oz (63.3 kg)   BMI 25.53 kg/m  CONSTITUTIONAL: Well-developed, well-nourished female in no acute distress.  PSYCHIATRIC: Normal mood and affect. Normal behavior. Normal judgment and thought content. [place]: Alert and oriented to person, place, and time. Normal muscle tone coordination. No cranial nerve deficit noted. HENT:  Normocephalic, atraumatic, External right and left ear normal.  EYES: Conjunctivae and EOM are normal.  NECK: Normal range of motion, supple, no masses.  Normal thyroid.  SKIN: Skin is warm and dry. No rash noted. Not diaphoretic. No erythema. No pallor. CARDIOVASCULAR: Normal heart rate noted, regular rhythm, no murmur. RESPIRATORY: Clear to auscultation bilaterally. Effort and breath sounds normal, no problems with respiration noted. BREASTS: Symmetric in size. No masses, skin changes, nipple drainage, or lymphadenopathy. ABDOMEN: Soft, no distention noted.  No tenderness, rebound or guarding.  BLADDER: Normal PELVIC:  External Genitalia: Normal  BUS: Normal  Vagina: Normal With good estrogen effect; good support; mild discharge noted   Cervix: Surgically Absent  Uterus: Surgically Absent   Adnexa: Surgically Absent   RV: External Exam NormaI, No Rectal Masses and Normal Sphincter tone  MUSCULOSKELETAL: Normal range of motion. No tenderness.  No cyanosis, clubbing, or edema.  2+ distal pulses. LYMPHATIC: No Axillary, Supraclavicular, or Inguinal Adenopathy.    Assessment:   Annual gynecologic examination 47 y.o. Contraception: status  post hysterectomy (TVH) Normal BMI - 25 History of endometriosis, Asymptomatic Surgical menopause, Asymptomatic on ERT Migraine headaches   Plan:  Pap:pap w/ rflx -per pt Mammogram: Ordered Stool Guaiac Testing:  Not Indicated Labs: Vit d, a1c, fbs, tsh. Lipid,  Routine preventative health maintenance measures emphasized: Exercise/Diet/Weight control, Tobacco Warnings and Alcohol/Substance use risks  Estrace cream twice weekly Refill estradiol 1 mg daily Fioricet prescription was filled Return to [place], CMA  Brayton Mars, MD  Note: This dictation was prepared  with Dragon dictation along with smaller phrase technology. Any transcriptional errors that result from this process are unintentional

## 2018-01-24 ENCOUNTER — Ambulatory Visit (INDEPENDENT_AMBULATORY_CARE_PROVIDER_SITE_OTHER): Payer: Managed Care, Other (non HMO) | Admitting: Obstetrics and Gynecology

## 2018-01-24 ENCOUNTER — Encounter: Payer: Self-pay | Admitting: Obstetrics and Gynecology

## 2018-01-24 VITALS — BP 127/90 | HR 96 | Ht 62.0 in | Wt 139.6 lb

## 2018-01-24 DIAGNOSIS — Z8742 Personal history of other diseases of the female genital tract: Secondary | ICD-10-CM | POA: Diagnosis not present

## 2018-01-24 DIAGNOSIS — Z8669 Personal history of other diseases of the nervous system and sense organs: Secondary | ICD-10-CM

## 2018-01-24 DIAGNOSIS — Z90710 Acquired absence of both cervix and uterus: Secondary | ICD-10-CM | POA: Diagnosis not present

## 2018-01-24 DIAGNOSIS — E8940 Asymptomatic postprocedural ovarian failure: Secondary | ICD-10-CM | POA: Diagnosis not present

## 2018-01-24 DIAGNOSIS — Z01419 Encounter for gynecological examination (general) (routine) without abnormal findings: Secondary | ICD-10-CM | POA: Diagnosis not present

## 2018-01-24 MED ORDER — ESTRADIOL 1 MG PO TABS: 1.0000 mg | ORAL_TABLET | Freq: Every day | ORAL | 3 refills | Status: DC

## 2018-01-24 MED ORDER — ESTRADIOL 0.1 MG/GM VA CREA: 0.2500 | TOPICAL_CREAM | VAGINAL | 3 refills | Status: DC

## 2018-01-24 MED ORDER — BUTALBITAL-APAP-CAFF-COD 50-325-40-30 MG PO CAPS: 2.0000 | ORAL_CAPSULE | ORAL | 1 refills | Status: DC | PRN

## 2018-01-24 NOTE — Patient Instructions (Signed)
1.  Pap smear is performed. 2.  Mammogram is ordered. 3.  Screening labs are ordered 4.  Continue with healthy eating and exercise 5.  Continue with Estrace cream intravaginal twice weekly 6.  Estradiol 1 mg daily is refilled for a year 7.  Return in 1 year for annual exam   Health Maintenance for Postmenopausal Women Menopause is a normal process in which your reproductive ability comes to an end. This process happens gradually over a span of months to years, usually between the ages of 47 and 10. Menopause is complete when you have missed 12 consecutive menstrual periods. It is important to talk with your health care provider about some of the most common conditions that affect postmenopausal women, such as heart disease, cancer, and bone loss (osteoporosis). Adopting a healthy lifestyle and getting preventive care can help to promote your health and wellness. Those actions can also lower your chances of developing some of these common conditions. What should I know about menopause? During menopause, you may experience a number of symptoms, such as:  Moderate-to-severe hot flashes.  Night sweats.  Decrease in sex drive.  Mood swings.  Headaches.  Tiredness.  Irritability.  Memory problems.  Insomnia.  Choosing to treat or not to treat menopausal changes is an individual decision that you make with your health care provider. What should I know about hormone replacement therapy and supplements? Hormone therapy products are effective for treating symptoms that are associated with menopause, such as hot flashes and night sweats. Hormone replacement carries certain risks, especially as you become older. If you are thinking about using estrogen or estrogen with progestin treatments, discuss the benefits and risks with your health care provider. What should I know about heart disease and stroke? Heart disease, heart attack, and stroke become more likely as you age. This may be due, in  part, to the hormonal changes that your body experiences during menopause. These can affect how your body processes dietary fats, triglycerides, and cholesterol. Heart attack and stroke are both medical emergencies. There are many things that you can do to help prevent heart disease and stroke:  Have your blood pressure checked at least every 1-2 years. High blood pressure causes heart disease and increases the risk of stroke.  If you are 3-74 years old, ask your health care provider if you should take aspirin to prevent a heart attack or a stroke.  Do not use any tobacco products, including cigarettes, chewing tobacco, or electronic cigarettes. If you need help quitting, ask your health care provider.  It is important to eat a healthy diet and maintain a healthy weight. ? Be sure to include plenty of vegetables, fruits, low-fat dairy products, and lean protein. ? Avoid eating foods that are high in solid fats, added sugars, or salt (sodium).  Get regular exercise. This is one of the most important things that you can do for your health. ? Try to exercise for at least 150 minutes each week. The type of exercise that you do should increase your heart rate and make you sweat. This is known as moderate-intensity exercise. ? Try to do strengthening exercises at least twice each week. Do these in addition to the moderate-intensity exercise.  Know your numbers.Ask your health care provider to check your cholesterol and your blood glucose. Continue to have your blood tested as directed by your health care provider.  What should I know about cancer screening? There are several types of cancer. Take the following steps to  reduce your risk and to catch any cancer development as early as possible. Breast Cancer  Practice breast self-awareness. ? This means understanding how your breasts normally appear and feel. ? It also means doing regular breast self-exams. Let your health care provider know about  any changes, no matter how small.  If you are 65 or older, have a clinician do a breast exam (clinical breast exam or CBE) every year. Depending on your age, family history, and medical history, it may be recommended that you also have a yearly breast X-ray (mammogram).  If you have a family history of breast cancer, talk with your health care provider about genetic screening.  If you are at high risk for breast cancer, talk with your health care provider about having an MRI and a mammogram every year.  Breast cancer (BRCA) gene test is recommended for women who have family members with BRCA-related cancers. Results of the assessment will determine the need for genetic counseling and BRCA1 and for BRCA2 testing. BRCA-related cancers include these types: ? Breast. This occurs in males or females. ? Ovarian. ? Tubal. This may also be called fallopian tube cancer. ? Cancer of the abdominal or pelvic lining (peritoneal cancer). ? Prostate. ? Pancreatic.  Cervical, Uterine, and Ovarian Cancer Your health care provider may recommend that you be screened regularly for cancer of the pelvic organs. These include your ovaries, uterus, and vagina. This screening involves a pelvic exam, which includes checking for microscopic changes to the surface of your cervix (Pap test).  For women ages 21-65, health care providers may recommend a pelvic exam and a Pap test every three years. For women ages 35-65, they may recommend the Pap test and pelvic exam, combined with testing for human papilloma virus (HPV), every five years. Some types of HPV increase your risk of cervical cancer. Testing for HPV may also be done on women of any age who have unclear Pap test results.  Other health care providers may not recommend any screening for nonpregnant women who are considered low risk for pelvic cancer and have no symptoms. Ask your health care provider if a screening pelvic exam is right for you.  If you have had  past treatment for cervical cancer or a condition that could lead to cancer, you need Pap tests and screening for cancer for at least 20 years after your treatment. If Pap tests have been discontinued for you, your risk factors (such as having a new sexual partner) need to be reassessed to determine if you should start having screenings again. Some women have medical problems that increase the chance of getting cervical cancer. In these cases, your health care provider may recommend that you have screening and Pap tests more often.  If you have a family history of uterine cancer or ovarian cancer, talk with your health care provider about genetic screening.  If you have vaginal bleeding after reaching menopause, tell your health care provider.  There are currently no reliable tests available to screen for ovarian cancer.  Lung Cancer Lung cancer screening is recommended for adults 76-47 years old who are at high risk for lung cancer because of a history of smoking. A yearly low-dose CT scan of the lungs is recommended if you:  Currently smoke.  Have a history of at least 30 pack-years of smoking and you currently smoke or have quit within the past 15 years. A pack-year is smoking an average of one pack of cigarettes per day for one year.  Yearly screening should:  Continue until it has been 15 years since you quit.  Stop if you develop a health problem that would prevent you from having lung cancer treatment.  Colorectal Cancer  This type of cancer can be detected and can often be prevented.  Routine colorectal cancer screening usually begins at age 79 and continues through age 15.  If you have risk factors for colon cancer, your health care provider may recommend that you be screened at an earlier age.  If you have a family history of colorectal cancer, talk with your health care provider about genetic screening.  Your health care provider may also recommend using home test kits to  check for hidden blood in your stool.  A small camera at the end of a tube can be used to examine your colon directly (sigmoidoscopy or colonoscopy). This is done to check for the earliest forms of colorectal cancer.  Direct examination of the colon should be repeated every 5-10 years until age 45. However, if early forms of precancerous polyps or small growths are found or if you have a family history or genetic risk for colorectal cancer, you may need to be screened more often.  Skin Cancer  Check your skin from head to toe regularly.  Monitor any moles. Be sure to tell your health care provider: ? About any new moles or changes in moles, especially if there is a change in a mole's shape or color. ? If you have a mole that is larger than the size of a pencil eraser.  If any of your family members has a history of skin cancer, especially at a young age, talk with your health care provider about genetic screening.  Always use sunscreen. Apply sunscreen liberally and repeatedly throughout the day.  Whenever you are outside, protect yourself by wearing long sleeves, pants, a wide-brimmed hat, and sunglasses.  What should I know about osteoporosis? Osteoporosis is a condition in which bone destruction happens more quickly than new bone creation. After menopause, you may be at an increased risk for osteoporosis. To help prevent osteoporosis or the bone fractures that can happen because of osteoporosis, the following is recommended:  If you are 58-59 years old, get at least 1,000 mg of calcium and at least 600 mg of vitamin D per day.  If you are older than age 106 but younger than age 27, get at least 1,200 mg of calcium and at least 600 mg of vitamin D per day.  If you are older than age 69, get at least 1,200 mg of calcium and at least 800 mg of vitamin D per day.  Smoking and excessive alcohol intake increase the risk of osteoporosis. Eat foods that are rich in calcium and vitamin D, and  do weight-bearing exercises several times each week as directed by your health care provider. What should I know about how menopause affects my mental health? Depression may occur at any age, but it is more common as you become older. Common symptoms of depression include:  Low or sad mood.  Changes in sleep patterns.  Changes in appetite or eating patterns.  Feeling an overall lack of motivation or enjoyment of activities that you previously enjoyed.  Frequent crying spells.  Talk with your health care provider if you think that you are experiencing depression. What should I know about immunizations? It is important that you get and maintain your immunizations. These include:  Tetanus, diphtheria, and pertussis (Tdap) booster vaccine.  Influenza  every year before the flu season begins.  Pneumonia vaccine.  Shingles vaccine.  Your health care provider may also recommend other immunizations. This information is not intended to replace advice given to you by your health care provider. Make sure you discuss any questions you have with your health care provider. Document Released: 10/06/2005 Document Revised: 03/03/2016 Document Reviewed: 05/18/2015 Elsevier Interactive Patient Education  2018 [employer]  

## 2018-01-25 LAB — GLUCOSE, RANDOM: GLUCOSE: 99 mg/dL (ref 65–99)

## 2018-01-25 LAB — LIPID PANEL
CHOL/HDL RATIO: 2.4 ratio (ref 0.0–4.4)
Cholesterol, Total: 184 mg/dL (ref 100–199)
HDL: 77 mg/dL (ref >39)
LDL CALC: 88 mg/dL (ref 0–99)
TRIGLYCERIDES: 95 mg/dL (ref 0–149)
VLDL Cholesterol Cal: 19 mg/dL (ref 5–40)

## 2018-01-25 LAB — TSH: TSH: 2.54 u[IU]/mL (ref 0.450–4.500)

## 2018-01-25 LAB — HEMOGLOBIN A1C
Est. average glucose Bld gHb Est-mCnc: 97 mg/dL
Hgb A1c MFr Bld: 5 % (ref 4.8–5.6)

## 2018-01-25 LAB — VITAMIN D 25 HYDROXY (VIT D DEFICIENCY, FRACTURES): Vit D, 25-Hydroxy: 52.7 ng/mL (ref 30.0–100.0)

## 2018-01-27 LAB — PAP IG W/ RFLX HPV ASCU: PAP SMEAR COMMENT: 0

## 2018-03-07 ENCOUNTER — Other Ambulatory Visit: Payer: Self-pay | Admitting: Obstetrics and Gynecology

## 2018-03-15 ENCOUNTER — Other Ambulatory Visit: Payer: Self-pay | Admitting: Obstetrics and Gynecology

## 2018-06-20 ENCOUNTER — Other Ambulatory Visit: Payer: Self-pay

## 2018-06-20 MED ORDER — BUTALBITAL-APAP-CAFF-COD 50-325-40-30 MG PO CAPS: 2.0000 | ORAL_CAPSULE | ORAL | 1 refills | Status: DC | PRN

## 2018-09-02 ENCOUNTER — Ambulatory Visit: Payer: Managed Care, Other (non HMO) | Admitting: Family Medicine

## 2018-09-02 ENCOUNTER — Encounter: Payer: Self-pay | Admitting: Family Medicine

## 2018-09-02 VITALS — BP 119/85 | HR 72 | Temp 98.2°F | Resp 16 | Ht 62.0 in | Wt 137.6 lb

## 2018-09-02 DIAGNOSIS — F3340 Major depressive disorder, recurrent, in remission, unspecified: Secondary | ICD-10-CM | POA: Insufficient documentation

## 2018-09-02 DIAGNOSIS — F411 Generalized anxiety disorder: Secondary | ICD-10-CM | POA: Diagnosis not present

## 2018-09-02 DIAGNOSIS — G43109 Migraine with aura, not intractable, without status migrainosus: Secondary | ICD-10-CM | POA: Diagnosis not present

## 2018-09-02 DIAGNOSIS — Z7689 Persons encountering health services in other specified circumstances: Secondary | ICD-10-CM | POA: Diagnosis not present

## 2018-09-02 DIAGNOSIS — G43909 Migraine, unspecified, not intractable, without status migrainosus: Secondary | ICD-10-CM | POA: Insufficient documentation

## 2018-09-02 DIAGNOSIS — E8940 Asymptomatic postprocedural ovarian failure: Secondary | ICD-10-CM

## 2018-09-02 DIAGNOSIS — F3342 Major depressive disorder, recurrent, in full remission: Secondary | ICD-10-CM | POA: Insufficient documentation

## 2018-09-02 DIAGNOSIS — F332 Major depressive disorder, recurrent severe without psychotic features: Secondary | ICD-10-CM | POA: Insufficient documentation

## 2018-09-02 DIAGNOSIS — F331 Major depressive disorder, recurrent, moderate: Secondary | ICD-10-CM | POA: Insufficient documentation

## 2018-09-02 DIAGNOSIS — F3341 Major depressive disorder, recurrent, in partial remission: Secondary | ICD-10-CM | POA: Insufficient documentation

## 2018-09-02 MED ORDER — VENLAFAXINE HCL ER 37.5 MG PO CP24: 37.5000 mg | ORAL_CAPSULE | Freq: Every day | ORAL | 2 refills | Status: DC
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
## 2018-09-02 NOTE — Assessment & Plan Note (Signed)
Consistent with chronic migraine HA without acute flare - Currently without active HA, well-appearing - Past treatment on Sumatriptan (had side effect). Do not see record of migraine prophylaxis med  Plan: 1. Continue current existing rx Fioricet PRN migraine as abortive therapy - Discussed goal of migraine prevention therapy - not focus of visit today, but will use dual benefit of SNRI as prophylaxis agent - Start Venlafaxine 37.12m daily for anxiety/mood and also migraine prevention - in future adjust dose and consider alternative options as well for migraine prevention - Goal to improve sleep Return criteria given for acute migraine
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
## 2018-09-02 NOTE — Assessment & Plan Note (Addendum)
Clinically some vasomotor symptoms w/ surgical hysterectomy menopause for several years, has been maintained by GYN on Estradiol orally 40m daily - [facility] he is planning to - so that she can re-establish for continued treatment of HRT if she desires this plan  May benefit from SNRI Venlafaxine currently w/ reduced vasomotor symtpoms

## 2018-09-02 NOTE — Assessment & Plan Note (Signed)
See A&P for anxiety GAD Clinically with previously undiagnosed major depression chronic recurrent, currently active - Start SNRI Venlafaxine for mood/anxiety, also for migraines - Offered therapist referral, she declined today, handout given - Follow-up as planned, adjust dose

## 2018-09-02 NOTE — Patient Instructions (Addendum)
Thank you for coming to the office today.  As discussed, it sounds like your symptoms are primarily related to anxiety / adjustment disorder. This is a very common problem and be related to several factors, including life stressors. Start treatment with Venlafaxine 37.51m daily, take daily for next 3 months. As discussed most anxiety medications are also used for mood disorders such as depression, because they work on similar chemicals in your brain. It may take up to 3-4 weeks for the medicine to take full effect and for you to notice a difference, sometimes you may notice it working sooner, otherwise we may need to adjust the dose.  For most patients with anxiety or mood concerns, we generally recommend referral to establish with a therapist or counselor as well. This has been shown to improve the effectiveness of the medications, and in the future we may be able to taper off medications. We will discuss at future appointments.    PSYCHIATRY / THERAPY-[place]  OBuena Irish LCSW [address] [id number] --------------------------------------------------  Self Referral:  1. Karen CSan [place]ino[place]  Address: [address] [id number]: Open today  9AM-7PM Phone: [phone] 2. [place] PWellingtonAddress: [address] [address]Phone: [phone] -----------------------------------------------------------------------  Sleep Hygiene Recommendations to promote healthy sleep in all patients, especially if symptoms of insomnia are worsening. Due to the nature of sleep rhythms, if your body gets "out of rhythm", it may take some time before your sleep cycle can be "reset".  Please try to follow as many of the following tips as you can, usually there are only a few of these are the primary cause of the problem.  ?To reset your  sleep rhythm, go to bed and get up at the same time every day ?Sleep only long enough to feel rested and then get out of bed ?Do not try to force yourself to sleep. If you can't sleep, get out of bed and try again later. ?Avoid naps during the day, unless excessively tired. The more sleeping during the day, then the less sleep your body needs at night.  ?Have coffee, tea, and other foods that have caffeine only in the morning ?Exercise several days a week, but not right before bed ?If you drink alcohol, prefer to have appropriate drink with one meal, but prefer to avoid alcohol in the evening, and bedtime ?If you smoke, avoid smoking, especially in the evening  ?Avoid watching TV or looking at phones, computers, or reading devices ("e-books") that give off light at least 30 minutes before bed. This artificial light sends "awake signals" to your brain and can make it harder to fall asleep. ?Make your bedroom a comfortable place where it is easy to fall asleep: ? Put up shades or special blackout curtains to block light from outside. ? Use a white noise machine to block noise. ? Keep the temperature cool. ?Try your best to solve or at least address your problems before you go to bed ?Use relaxation techniques to manage stress. Ask your health care provider to suggest some techniques that may work well for you. These may include: ? Breathing exercises. ? Routines to release muscle tension. ? Visualizing peaceful scenes.   Please schedule a Follow-up Appointment to: Return in about 3 months (around 12/02/2018) for 3 month follow-up Mood/Anxiety PHQ/GAD med adjust, Migraines.  If you have any other questions or concerns, please feel free to call the office or send a message through [place]. You may also schedule an earlier appointment if necessary.  Additionally, you may be receiving a survey about your experience at our office within a few days to 1 week by e-mail or mail. We value your  feedback.  Nobie Putnam, DO [place]

## 2018-10-01 DIAGNOSIS — G43109 Migraine with aura, not intractable, without status migrainosus: Secondary | ICD-10-CM

## 2018-10-01 MED ORDER — BUTALBITAL-APAP-CAFF-COD 50-325-40-30 MG PO CAPS: 2.0000 | ORAL_CAPSULE | ORAL | 1 refills | Status: DC | PRN

## 2018-12-02 ENCOUNTER — Other Ambulatory Visit: Payer: Self-pay | Admitting: Family Medicine

## 2018-12-02 ENCOUNTER — Encounter: Payer: Self-pay | Admitting: Family Medicine

## 2018-12-02 ENCOUNTER — Ambulatory Visit (INDEPENDENT_AMBULATORY_CARE_PROVIDER_SITE_OTHER): Payer: Managed Care, Other (non HMO) | Admitting: Family Medicine

## 2018-12-02 ENCOUNTER — Other Ambulatory Visit: Payer: Self-pay

## 2018-12-02 DIAGNOSIS — F411 Generalized anxiety disorder: Secondary | ICD-10-CM

## 2018-12-02 DIAGNOSIS — G43109 Migraine with aura, not intractable, without status migrainosus: Secondary | ICD-10-CM | POA: Diagnosis not present

## 2018-12-02 DIAGNOSIS — F3340 Major depressive disorder, recurrent, in remission, unspecified: Secondary | ICD-10-CM

## 2018-12-02 DIAGNOSIS — Z0000 Encounter for general adult medical examination without abnormal findings: Secondary | ICD-10-CM

## 2018-12-02 MED ORDER — BUTALBITAL-APAP-CAFF-COD 50-325-40-30 MG PO CAPS: 1.0000 | ORAL_CAPSULE | ORAL | 2 refills | Status: DC | PRN
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
## 2018-12-02 NOTE — Assessment & Plan Note (Signed)
Limited benefit on SNRI prevention Consistent with chronic migraine HA without acute flare - Past treatment on Sumatriptan (had side effect), Venlafaxine (GI intolerance)  Plan: 1. Continue current existing rx Fioricet PRN migraine as abortive therapy - reorder today - Discussed goal of migraine prevention therapy again - defer for now - since adjusting to new med for mood/anxiety, will reconsider other prophylaxis options such as Gabapentin, Topamax, Betablocker in future at next visit - Goal to improve sleep Return criteria given for acute migraine

## 2018-12-02 NOTE — Assessment & Plan Note (Signed)
See A&P for anxiety GAD Dramatically improved on Herbal treatment and off SNRI Venlafaxine Comorbid with GAD, insomnia F/u as planned

## 2018-12-02 NOTE — Patient Instructions (Signed)
Continue current herbal medication as it is working  We will discuss migraines further at next visit   DUE for FASTING BLOOD WORK (no food or drink after midnight before the lab appointment, only water or coffee without cream/sugar on the morning of)  SCHEDULE "Lab Only" visit in the morning at the clinic for lab draw in 3 MONTHS   - Make sure Lab Only appointment is at about 1 week before your next appointment, so that results will be available  For Lab Results, once available within 2-3 days of blood draw, you can can log in to MyChart online to view your results and a brief explanation. Also, we can discuss results at next follow-up visit.   Please schedule a Follow-up Appointment to: Return in about 3 months (around 03/03/2019) for Annual Physical.  If you have any other questions or concerns, please feel free to call the office or send a message through [place]. You may also schedule an earlier appointment if necessary.  Additionally, you may be receiving a survey about your experience at our office within a few days to 1 week by e-mail or mail. We value your feedback.  Nobie Putnam, DO [place]
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
## 2018-12-21 DIAGNOSIS — F411 Generalized anxiety disorder: Secondary | ICD-10-CM

## 2018-12-23 MED ORDER — VENLAFAXINE HCL ER 37.5 MG PO CP24: 37.5000 mg | ORAL_CAPSULE | Freq: Every day | ORAL | 0 refills | Status: DC

## 2018-12-23 NOTE — Addendum Note (Signed)
Addended by: Olin Hauser on: 12/23/2018 01:08 PM   Modules accepted: Orders

## 2019-01-29 ENCOUNTER — Encounter: Payer: Managed Care, Other (non HMO) | Admitting: Obstetrics and Gynecology

## 2019-01-30 ENCOUNTER — Telehealth: Payer: Self-pay

## 2019-01-30 NOTE — Telephone Encounter (Signed)
The pt rescheduled her lab appt.

## 2019-02-10 ENCOUNTER — Other Ambulatory Visit: Payer: Self-pay | Admitting: Family Medicine

## 2019-02-10 DIAGNOSIS — F3340 Major depressive disorder, recurrent, in remission, unspecified: Secondary | ICD-10-CM

## 2019-02-10 DIAGNOSIS — Z0000 Encounter for general adult medical examination without abnormal findings: Secondary | ICD-10-CM

## 2019-02-10 DIAGNOSIS — F411 Generalized anxiety disorder: Secondary | ICD-10-CM

## 2019-02-10 DIAGNOSIS — G43109 Migraine with aura, not intractable, without status migrainosus: Secondary | ICD-10-CM

## 2019-02-21 ENCOUNTER — Encounter: Payer: Managed Care, Other (non HMO) | Admitting: Obstetrics and Gynecology

## 2019-02-24 ENCOUNTER — Other Ambulatory Visit: Payer: Managed Care, Other (non HMO)

## 2019-02-26 ENCOUNTER — Other Ambulatory Visit: Payer: Managed Care, Other (non HMO)

## 2019-02-26 LAB — COMPREHENSIVE METABOLIC PANEL
ALT: 13 IU/L (ref 0–32)
AST: 10 IU/L (ref 0–40)
Albumin/Globulin Ratio: 1.7 (ref 1.2–2.2)
Albumin: 4.6 g/dL (ref 3.8–4.8)
Alkaline Phosphatase: 66 IU/L (ref 39–117)
BUN/Creatinine Ratio: 12 (ref 9–23)
BUN: 12 mg/dL (ref 6–24)
Bilirubin Total: 0.5 mg/dL (ref 0.0–1.2)
CO2: 24 mmol/L (ref 20–29)
Calcium: 9.3 mg/dL (ref 8.7–10.2)
Chloride: 105 mmol/L (ref 96–106)
Creatinine, Ser: 1 mg/dL (ref 0.57–1.00)
GFR calc Af Amer: 79 mL/min/{1.73_m2} (ref >59)
GFR calc non Af Amer: 68 mL/min/{1.73_m2} (ref >59)
Globulin, Total: 2.7 g/dL (ref 1.5–4.5)
Glucose: 100 mg/dL — ABNORMAL HIGH (ref 65–99)
Potassium: 5 mmol/L (ref 3.5–5.2)
Sodium: 145 mmol/L — ABNORMAL HIGH (ref 134–144)
Total Protein: 7.3 g/dL (ref 6.0–8.5)

## 2019-02-26 LAB — CBC WITH DIFFERENTIAL/PLATELET
Basophils Absolute: 0.1 10*3/uL (ref 0.0–0.2)
Basos: 1 %
EOS (ABSOLUTE): 0.1 10*3/uL (ref 0.0–0.4)
Eos: 3 %
Hematocrit: 43.5 % (ref 34.0–46.6)
Hemoglobin: 14.1 g/dL (ref 11.1–15.9)
Immature Grans (Abs): 0 10*3/uL (ref 0.0–0.1)
Immature Granulocytes: 0 %
Lymphocytes Absolute: 1.5 10*3/uL (ref 0.7–3.1)
Lymphs: 36 %
MCH: 29.9 pg (ref 26.6–33.0)
MCHC: 32.4 g/dL (ref 31.5–35.7)
MCV: 92 fL (ref 79–97)
Monocytes Absolute: 0.4 10*3/uL (ref 0.1–0.9)
Monocytes: 8 %
Neutrophils Absolute: 2.1 10*3/uL (ref 1.4–7.0)
Neutrophils: 52 %
Platelets: 218 10*3/uL (ref 150–450)
RBC: 4.71 x10E6/uL (ref 3.77–5.28)
RDW: 12.6 % (ref 11.7–15.4)
WBC: 4.2 10*3/uL (ref 3.4–10.8)

## 2019-02-26 LAB — LIPID PANEL
Chol/HDL Ratio: 3 ratio (ref 0.0–4.4)
Cholesterol, Total: 192 mg/dL (ref 100–199)
HDL: 63 mg/dL (ref >39)
LDL Calculated: 102 mg/dL — ABNORMAL HIGH (ref 0–99)
Triglycerides: 137 mg/dL (ref 0–149)
VLDL Cholesterol Cal: 27 mg/dL (ref 5–40)

## 2019-02-26 LAB — HEMOGLOBIN A1C
Est. average glucose Bld gHb Est-mCnc: 103 mg/dL
Hgb A1c MFr Bld: 5.2 % (ref 4.8–5.6)

## 2019-02-26 LAB — T4, FREE: Free T4: 1.03 ng/dL (ref 0.82–1.77)

## 2019-02-26 LAB — TSH: TSH: 1.76 u[IU]/mL (ref 0.450–4.500)

## 2019-03-03 ENCOUNTER — Ambulatory Visit (INDEPENDENT_AMBULATORY_CARE_PROVIDER_SITE_OTHER): Payer: Managed Care, Other (non HMO) | Admitting: Family Medicine

## 2019-03-03 ENCOUNTER — Other Ambulatory Visit: Payer: Self-pay

## 2019-03-03 ENCOUNTER — Encounter: Payer: Self-pay | Admitting: Family Medicine

## 2019-03-03 VITALS — BP 111/81 | HR 75 | Temp 98.2°F | Resp 16 | Ht 62.0 in | Wt 136.0 lb

## 2019-03-03 DIAGNOSIS — F411 Generalized anxiety disorder: Secondary | ICD-10-CM | POA: Diagnosis not present

## 2019-03-03 DIAGNOSIS — F3340 Major depressive disorder, recurrent, in remission, unspecified: Secondary | ICD-10-CM | POA: Diagnosis not present

## 2019-03-03 DIAGNOSIS — Z0000 Encounter for general adult medical examination without abnormal findings: Secondary | ICD-10-CM | POA: Diagnosis not present

## 2019-03-03 DIAGNOSIS — Z1239 Encounter for other screening for malignant neoplasm of breast: Secondary | ICD-10-CM | POA: Diagnosis not present

## 2019-03-03 DIAGNOSIS — G43109 Migraine with aura, not intractable, without status migrainosus: Secondary | ICD-10-CM

## 2019-03-03 MED ORDER — VENLAFAXINE HCL ER 75 MG PO CP24: 75.0000 mg | ORAL_CAPSULE | Freq: Every day | ORAL | 1 refills | Status: DC

## 2019-03-03 MED ORDER — BUSPIRONE HCL 5 MG PO TABS: 5.0000 mg | ORAL_TABLET | Freq: Two times a day (BID) | ORAL | 2 refills | Status: DC | PRN

## 2019-03-03 NOTE — Patient Instructions (Addendum)
Thank you for coming to the office today.  Keep taking Venlafaxine 60m - once daily, new rx sent to [facility]  Added Buspar (bupsirone) 551m- can take anywhere from once daily every day for anxiety and add an extra pill as needed for panic, or can take up to 72m52m-3 times a day or higher dose 1-2 times a day either 5 to 7.5 to 11m90mUpdate in 1 month on mychart if you have any quick questions or updates, if not improving or need significant change to plan, then schedule telephone visit or in person.  ---------   STay tuned for updates from GYN - we will review.  --------------  For Mammogram screening for breast cancer   Call the [facility] anytime to schedule your own appointment now that order has been placed.  [facility] [id number]: [phone] Cancer Screening: - For all adults age 46+ 4+tine colon cancer screening is highly recommended.     - Recent guidelines from [employer] starting age of 45 -51arly detection of colon cancer is important, because often there are no warning signs or symptoms, also if found early usually it can be cured. Late stage is hard to treat.  - If you are not interested in Colonoscopy screening (if done and normal you could be cleared for 5 to 10 years until next due), then Cologuard is an excellent alternative for screening test for Colon Cancer. It is highly sensitive for detecting DNA of colon cancer from even the earliest stages. Also, there is NO bowel prep required. - If Cologuard is NEGATIVE, then it is good for 3 years before next due - If Cologuard is POSITIVE, then it is strongly advised to get a Colonoscopy, which allows the GI doctor to locate the source of the cancer or polyp (even very early stage) and treat it by removing it. ------------------------- If you would like to proceed with Cologuard (stool DNA  test) - FIRST, call your insurance company and tell them you want to check cost of Cologuard tell them CPT Code [phone] may be completely covered and you could get for no cost, OR max cost without any coverage is about $600). Also, keep in mind if you do NOT open the kit, and decide not to do the test, you will NOT be charged, you should contact the company if you decide not to do the test. - If you want to proceed, you can notify us ([place] message, [facility] at next visit) and we will order it for you. The test kit will be delivered to you house within about 1 week. Follow instructions to collect sample, you may call the company for any help or questions, 24/7 telephone support at [phone] schedule a Follow-up Appointment to: Return in about 1 year (around 03/02/2020) for Annual Physical.  If you have any other questions or concerns, please feel free to call the office or send a message through [facility] may also schedule an earlier appointment if necessary.  Additionally, you may be receiving a survey about your experience at our office within a few days to 1 week by e-mail or mail. We value your feedback.  AlexNobie Putnam [place]
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
## 2019-03-03 NOTE — Assessment & Plan Note (Signed)
Now significant improvement, near resolution on migraine prophylaxis with higher dose SNRI Infrequent headaches now - Past treatment on Sumatriptan (had side effect)  Plan: 1. Continue Venlafaxine 66mg daily seems more effective than 37.5 in past failed - Future consider other SSRI vs SNRI for mood/anxiety also can reconsider other prophylaxis options such as Gabapentin, Topamax, Betablocker in future if need - Goal to improve sleep Return criteria given for acute migraine

## 2019-03-03 NOTE — Assessment & Plan Note (Signed)
See A&P for anxiety GAD Improved mood, denies depression. Comorbid with GAD, insomnia F/u as planned
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
## 2019-03-07 ENCOUNTER — Ambulatory Visit (INDEPENDENT_AMBULATORY_CARE_PROVIDER_SITE_OTHER): Payer: Managed Care, Other (non HMO) | Admitting: Obstetrics and Gynecology

## 2019-03-07 ENCOUNTER — Encounter: Payer: Self-pay | Admitting: Obstetrics and Gynecology

## 2019-03-07 ENCOUNTER — Other Ambulatory Visit: Payer: Self-pay

## 2019-03-07 VITALS — BP 121/80 | HR 73 | Ht 62.0 in | Wt 134.8 lb

## 2019-03-07 DIAGNOSIS — Z01419 Encounter for gynecological examination (general) (routine) without abnormal findings: Secondary | ICD-10-CM | POA: Diagnosis not present

## 2019-03-07 DIAGNOSIS — Z90710 Acquired absence of both cervix and uterus: Secondary | ICD-10-CM | POA: Diagnosis not present

## 2019-03-07 DIAGNOSIS — E8940 Asymptomatic postprocedural ovarian failure: Secondary | ICD-10-CM

## 2019-03-07 DIAGNOSIS — Z8742 Personal history of other diseases of the female genital tract: Secondary | ICD-10-CM

## 2019-03-07 DIAGNOSIS — G43109 Migraine with aura, not intractable, without status migrainosus: Secondary | ICD-10-CM

## 2019-03-07 MED ORDER — ESTRADIOL 1 MG PO TABS: 1.0000 mg | ORAL_TABLET | Freq: Every day | ORAL | 3 refills | Status: DC

## 2019-03-07 MED ORDER — BUTALBITAL-APAP-CAFF-COD 50-325-40-30 MG PO CAPS: 1.0000 | ORAL_CAPSULE | ORAL | 2 refills | Status: DC | PRN

## 2019-03-07 NOTE — Progress Notes (Signed)
Subjective:   Carmen Logan is a 48 y.o. G68P2002 Caucasian female here for a routine well-woman exam.  No LMP recorded. Patient has had a hysterectomy.    Current complaints: migraines have lessened since starting effexor. PCP: Vonita Moss       doesn't desire labs  Social History: Sexual: heterosexual Marital Status: married Living situation: with family Occupation: [employer] employee Tobacco/alcohol: no tobacco use Illicit drugs: no history of illicit drug use  The following portions of the patient's history were reviewed and updated as appropriate: allergies, current medications, past family history, past medical history, past social history, past surgical history and problem list.  Past Medical History Past Medical History:  Diagnosis Date  . Anxiety   . Cervical dysplasia   . Depression   . Dyspareunia, female   . Endometriosis   . Facial skin lesion   . HPV test positive   . Migraine   . Surgical menopause     Past Surgical History Past Surgical History:  Procedure Laterality Date  . CERVICAL DISCECTOMY  03/17/2014   w/ fusion and plating  . CRYOTHERAPY     cervix  . VAGINAL HYSTERECTOMY  10/2009   Total vaginal hysterectomy, lso    Gynecologic History G2P2002  No LMP recorded. Patient has had a hysterectomy. Contraception: status post hysterectomy Last Pap: 12/2017. Results were: normal Last mammogram: 06/2017. Results were: normal   Obstetric History OB History  Gravida Para Term Preterm AB Living  2 2 2     2   SAB TAB Ectopic Multiple Live Births          2    # Outcome Date GA Lbr Len/2nd Weight Sex Delivery Anes PTL Lv  2 Term 1999   6 lb 14.4 oz (3.13 kg) F Vag-Spont   LIV  1 Term 1995   6 lb 4.8 oz (2.858 kg) F Vag-Spont   LIV    Current Medications Current Outpatient Medications on File Prior to Visit  Medication Sig Dispense Refill  . busPIRone (BUSPAR) 5 MG tablet Take 1 tablet (5 mg total) by mouth 2 (two) times daily as needed  (anxiety). 60 tablet 2  . butalbital-acetaminophen-caffeine (FIORICET/CODEINE) 50-325-40-30 MG capsule Take 1-2 capsules by mouth every 4 (four) hours as needed for headache or migraine. No more than 6 qd 30 capsule 2  . calcium-vitamin D (OSCAL WITH D) 250-125 MG-UNIT tablet Take 1 tablet by mouth daily.    Marland Kitchen estradiol (ESTRACE) 0.1 MG/GM vaginal cream Place 9.23 Applicatorfuls vaginally 2 (two) times a week. 42.5 g 1  . estradiol (ESTRACE) 1 MG tablet TAKE 1 TABLET BY MOUTH  DAILY 90 tablet 3  . MELATONIN PO Take 12 mg by mouth daily.    Marland Kitchen omeprazole (PRILOSEC) 20 MG capsule Take 20 mg by mouth as needed.     . venlafaxine XR (EFFEXOR XR) 75 MG 24 hr capsule Take 1 capsule (75 mg total) by mouth daily with breakfast. 90 capsule 1   No current facility-administered medications on file prior to visit.     Review of Systems Patient denies any headaches, blurred vision, shortness of breath, chest pain, abdominal pain, problems with bowel movements, urination, or intercourse.  Objective:  BP 121/80   Pulse 73   Ht 5' 2"  (1.575 m)   Wt 134 lb 12.8 oz (61.1 kg)   BMI 24.66 kg/m  Physical Exam  General:  Well developed, well nourished, no acute distress. She is alert and oriented x3. Skin:  Warm and  dry Neck:  Midline trachea, no thyromegaly or nodules Cardiovascular: Regular rate and rhythm, no murmur heard Lungs:  Effort normal, all lung fields clear to auscultation bilaterally Breasts:  No dominant palpable mass, retraction, or nipple discharge Abdomen:  Soft, non tender, no hepatosplenomegaly or masses Pelvic:  External genitalia is normal in appearance.  The vagina is normal in appearance. The cervix is surgically absent Uterus is surgically absent.  No adnexal masses or tenderness noted. Extremities:  No swelling or varicosities noted Psych:  She has a normal mood and affect  Assessment:   Healthy well-woman exam  Plan:  Estrogen refilled- not the cream as she hasn't been using  it.  F/U 1 year for AE, or sooner if needed Mammogram ordered by PCP  Antonina Deziel Rockney Ghee, CNM

## 2019-06-08 ENCOUNTER — Other Ambulatory Visit: Payer: Self-pay | Admitting: Obstetrics and Gynecology

## 2019-06-08 DIAGNOSIS — G43109 Migraine with aura, not intractable, without status migrainosus: Secondary | ICD-10-CM

## 2019-06-09 ENCOUNTER — Other Ambulatory Visit: Payer: Self-pay

## 2019-06-09 DIAGNOSIS — G43109 Migraine with aura, not intractable, without status migrainosus: Secondary | ICD-10-CM

## 2019-06-10 ENCOUNTER — Encounter: Payer: Self-pay | Admitting: Surgical

## 2019-06-13 ENCOUNTER — Other Ambulatory Visit: Payer: Self-pay | Admitting: Family Medicine

## 2019-06-13 DIAGNOSIS — F411 Generalized anxiety disorder: Secondary | ICD-10-CM

## 2019-07-04 ENCOUNTER — Other Ambulatory Visit: Payer: Self-pay

## 2019-07-04 ENCOUNTER — Ambulatory Visit (INDEPENDENT_AMBULATORY_CARE_PROVIDER_SITE_OTHER): Payer: Managed Care, Other (non HMO) | Admitting: Family Medicine

## 2019-07-04 ENCOUNTER — Encounter: Payer: Self-pay | Admitting: Family Medicine

## 2019-07-04 DIAGNOSIS — F331 Major depressive disorder, recurrent, moderate: Secondary | ICD-10-CM

## 2019-07-04 DIAGNOSIS — F411 Generalized anxiety disorder: Secondary | ICD-10-CM | POA: Diagnosis not present

## 2019-07-04 MED ORDER — BUSPIRONE HCL 7.5 MG PO TABS: 7.5000 mg | ORAL_TABLET | Freq: Two times a day (BID) | ORAL | 2 refills | Status: DC | PRN

## 2019-07-04 MED ORDER — SERTRALINE HCL 50 MG PO TABS: 50.0000 mg | ORAL_TABLET | Freq: Every day | ORAL | 2 refills | Status: DC

## 2019-07-04 NOTE — Patient Instructions (Addendum)
Reduce Effexor to one pill every other day for 1 week, to transition to Sertraline/Zoloft 3m daily - may need higher dose in future, likely after 4-6 weeks.  INCREASE Buspar to new rx 7.573mpill twice a day for anxiety  Let me know if need wellbutrin or trazodone as discussed  Let me know if need referral to therapist or other specialist  Please schedule a Follow-up Appointment to: Return in about 3 months (around 10/04/2019) for 3 month anxiety depression.  If you have any other questions or concerns, please feel free to call the office or send a message through [facility] [place] may also schedule an earlier appointment if necessary.  Additionally, you may be receiving a survey about your experience at our office within a few days to 1 week by e-mail or mail. We value your feedback.  AlNobie PutnamDO [employer]
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
## 2019-07-06 ENCOUNTER — Encounter: Payer: Self-pay | Admitting: Family Medicine

## 2019-07-06 NOTE — Assessment & Plan Note (Signed)
See A&P for anxiety GAD Improved mood, denies depression. Comorbid with GAD, insomnia F/u as planned
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
## 2019-07-31 DIAGNOSIS — F331 Major depressive disorder, recurrent, moderate: Secondary | ICD-10-CM

## 2019-07-31 MED ORDER — TRAZODONE HCL 50 MG PO TABS: 50.0000 mg | ORAL_TABLET | Freq: Every day | ORAL | 2 refills | Status: DC

## 2019-08-30 ENCOUNTER — Other Ambulatory Visit: Payer: Self-pay | Admitting: Family Medicine

## 2019-08-30 DIAGNOSIS — F411 Generalized anxiety disorder: Secondary | ICD-10-CM

## 2019-08-30 DIAGNOSIS — F3340 Major depressive disorder, recurrent, in remission, unspecified: Secondary | ICD-10-CM

## 2019-08-30 DIAGNOSIS — G43109 Migraine with aura, not intractable, without status migrainosus: Secondary | ICD-10-CM

## 2019-09-02 ENCOUNTER — Other Ambulatory Visit: Payer: Self-pay

## 2019-09-02 DIAGNOSIS — F331 Major depressive disorder, recurrent, moderate: Secondary | ICD-10-CM

## 2019-09-02 MED ORDER — SERTRALINE HCL 50 MG PO TABS: 50.0000 mg | ORAL_TABLET | Freq: Every day | ORAL | 5 refills | Status: DC

## 2019-09-29 ENCOUNTER — Other Ambulatory Visit: Payer: Self-pay | Admitting: Family Medicine

## 2019-09-29 DIAGNOSIS — F331 Major depressive disorder, recurrent, moderate: Secondary | ICD-10-CM

## 2019-09-29 MED ORDER — TRAZODONE HCL 50 MG PO TABS: 50.0000 mg | ORAL_TABLET | Freq: Every day | ORAL | 1 refills | Status: DC

## 2019-09-29 NOTE — Telephone Encounter (Signed)
Patient called requesting 90 days Rx. Fax request is in your box.

## 2019-09-30 ENCOUNTER — Other Ambulatory Visit: Payer: Self-pay | Admitting: Family Medicine

## 2019-09-30 DIAGNOSIS — F411 Generalized anxiety disorder: Secondary | ICD-10-CM

## 2019-11-07 DIAGNOSIS — G43109 Migraine with aura, not intractable, without status migrainosus: Secondary | ICD-10-CM

## 2019-11-07 MED ORDER — BUTALBITAL-APAP-CAFF-COD 50-325-40-30 MG PO CAPS: ORAL_CAPSULE | ORAL | 1 refills | Status: DC

## 2019-12-01 DIAGNOSIS — F331 Major depressive disorder, recurrent, moderate: Secondary | ICD-10-CM

## 2019-12-01 MED ORDER — SERTRALINE HCL 50 MG PO TABS: 50.0000 mg | ORAL_TABLET | Freq: Every day | ORAL | 3 refills | Status: DC

## 2020-02-23 DIAGNOSIS — E8940 Asymptomatic postprocedural ovarian failure: Secondary | ICD-10-CM

## 2020-02-23 MED ORDER — ESTRADIOL 1 MG PO TABS: 1.0000 mg | ORAL_TABLET | Freq: Every day | ORAL | 0 refills | Status: DC

## 2020-02-28 ENCOUNTER — Other Ambulatory Visit: Payer: Self-pay | Admitting: Family Medicine

## 2020-02-28 DIAGNOSIS — F331 Major depressive disorder, recurrent, moderate: Secondary | ICD-10-CM

## 2020-02-28 NOTE — Telephone Encounter (Signed)
Requested Prescriptions  Pending Prescriptions Disp Refills  . traZODone (DESYREL) 50 MG tablet [Pharmacy Med Name: TRAZODONE 50MG TABLETS] 90 tablet 0    Sig: TAKE 1 TABLET BY MOUTH EVERY NIGHT AT BEDTIME. MAY START WITH 1/2 TABLET EVERY NIGHT AT BEDTIME IF PREFERRED     Psychiatry: Antidepressants - Serotonin Modulator Failed - 02/28/2020  6:25 AM      Failed - Valid encounter within last 6 months    Recent Outpatient Visits          7 months ago Recurrent depressive disorder, current episode moderate ([place])   [place], DO   12 months ago Annual physical exam   [place], DO   1 year ago Chronic migraine with aura   [place], DO   1 year ago GAD (generalized anxiety disorder)   [place], DO      Future Appointments            In 1 week [place], Devonne Doughty, DO [facility] [place] - Completed PHQ-2 or PHQ-9 in the last 360 days.      Courtesy refill given, as patient has an appointment in one week with provider.

## 2020-03-08 ENCOUNTER — Ambulatory Visit (INDEPENDENT_AMBULATORY_CARE_PROVIDER_SITE_OTHER): Payer: Managed Care, Other (non HMO) | Admitting: Family Medicine

## 2020-03-08 ENCOUNTER — Encounter: Payer: Self-pay | Admitting: Family Medicine

## 2020-03-08 ENCOUNTER — Other Ambulatory Visit: Payer: Self-pay

## 2020-03-08 VITALS — BP 92/53 | HR 62 | Temp 98.0°F | Resp 16 | Ht 62.0 in | Wt 116.8 lb

## 2020-03-08 DIAGNOSIS — F411 Generalized anxiety disorder: Secondary | ICD-10-CM | POA: Diagnosis not present

## 2020-03-08 DIAGNOSIS — Z0000 Encounter for general adult medical examination without abnormal findings: Secondary | ICD-10-CM

## 2020-03-08 DIAGNOSIS — Z114 Encounter for screening for human immunodeficiency virus [HIV]: Secondary | ICD-10-CM

## 2020-03-08 DIAGNOSIS — F3340 Major depressive disorder, recurrent, in remission, unspecified: Secondary | ICD-10-CM

## 2020-03-08 DIAGNOSIS — Z1211 Encounter for screening for malignant neoplasm of colon: Secondary | ICD-10-CM

## 2020-03-08 DIAGNOSIS — G4726 Circadian rhythm sleep disorder, shift work type: Secondary | ICD-10-CM

## 2020-03-08 DIAGNOSIS — E8940 Asymptomatic postprocedural ovarian failure: Secondary | ICD-10-CM | POA: Diagnosis not present

## 2020-03-08 DIAGNOSIS — F5104 Psychophysiologic insomnia: Secondary | ICD-10-CM

## 2020-03-08 DIAGNOSIS — Z1159 Encounter for screening for other viral diseases: Secondary | ICD-10-CM

## 2020-03-08 DIAGNOSIS — E559 Vitamin D deficiency, unspecified: Secondary | ICD-10-CM

## 2020-03-08 DIAGNOSIS — G4700 Insomnia, unspecified: Secondary | ICD-10-CM | POA: Insufficient documentation

## 2020-03-08 MED ORDER — TRAZODONE HCL 50 MG PO TABS: 50.0000 mg | ORAL_TABLET | Freq: Every day | ORAL | 3 refills | Status: DC
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
## 2020-03-08 NOTE — Assessment & Plan Note (Signed)
Continue Estradiol 9m daily until can return to GYN
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
## 2020-03-08 NOTE — Assessment & Plan Note (Signed)
Improved now on current med regimen and lifestyle

## 2020-03-08 NOTE — Assessment & Plan Note (Signed)
See A&P MDD / Anxiety Controlled now on SSRI and Trazodone, secondary to 3rd shift sleep work disorder and mood

## 2020-03-08 NOTE — Patient Instructions (Addendum)
Thank you for coming to the office today.  Refilled Trazodone, added extra refills  All [facility] orders printed.  Colon Cancer Screening: - For all adults age 47+ routine colon cancer screening is highly recommended.     - Recent guidelines from [place] recommend starting age of 53 - Early detection of colon cancer is important, because often there are no warning signs or symptoms, also if found early usually it can be cured. Late stage is hard to treat.  - If you are not interested in Colonoscopy screening (if done and normal you could be cleared for 5 to 10 years until next due), then Cologuard is an excellent alternative for screening test for Colon Cancer. It is highly sensitive for detecting DNA of colon cancer from even the earliest stages. Also, there is NO bowel prep required. - If Cologuard is NEGATIVE, then it is good for 3 years before next due - If Cologuard is POSITIVE, then it is strongly advised to get a Colonoscopy, which allows the GI doctor to locate the source of the cancer or polyp (even very early stage) and treat it by removing it. ------------------------- If you would like to proceed with Cologuard (stool DNA test) - FIRST, call your insurance company and tell them you want to check cost of Cologuard tell them CPT Code [id number] (it may be completely covered and you could get for no cost, OR max cost without any coverage is about $600). Also, keep in mind if you do NOT open the kit, and decide not to do the test, you will NOT be charged, you should contact the company if you decide not to do the test. - If you want to proceed, you can notify us (phone message, Jaylah Goodlow, or at next visit) and we will order it for you. The test kit will be delivered to you house within about 1 week. Follow instructions to collect sample, you may call the company for any help or questions, 24/7 telephone support at [phone].   Please schedule a Follow-up Appointment  to: Return in about 1 year (around 03/08/2021) for Annual Physical.  If you have any other questions or concerns, please feel free to call the office or send a message through [place]. You may also schedule an earlier appointment if necessary.  Additionally, you may be receiving a survey about your experience at our office within a few days to 1 week by e-mail or mail. We value your feedback.  Nobie Putnam, DO [facility]

## 2020-03-10 DIAGNOSIS — G43109 Migraine with aura, not intractable, without status migrainosus: Secondary | ICD-10-CM

## 2020-03-10 MED ORDER — BUTALBITAL-APAP-CAFF-COD 50-325-40-30 MG PO CAPS: ORAL_CAPSULE | ORAL | 1 refills | Status: DC

## 2020-03-30 LAB — CBC WITH DIFFERENTIAL/PLATELET
Basophils Absolute: 0 10*3/uL (ref 0.0–0.2)
Basos: 1 %
EOS (ABSOLUTE): 0 10*3/uL (ref 0.0–0.4)
Eos: 1 %
Hematocrit: 38.5 % (ref 34.0–46.6)
Hemoglobin: 12.5 g/dL (ref 11.1–15.9)
Immature Grans (Abs): 0 10*3/uL (ref 0.0–0.1)
Immature Granulocytes: 0 %
Lymphocytes Absolute: 1.6 10*3/uL (ref 0.7–3.1)
Lymphs: 28 %
MCH: 30 pg (ref 26.6–33.0)
MCHC: 32.5 g/dL (ref 31.5–35.7)
MCV: 92 fL (ref 79–97)
Monocytes Absolute: 0.3 10*3/uL (ref 0.1–0.9)
Monocytes: 5 %
Neutrophils Absolute: 4 10*3/uL (ref 1.4–7.0)
Neutrophils: 65 %
Platelets: 176 10*3/uL (ref 150–450)
RBC: 4.17 x10E6/uL (ref 3.77–5.28)
RDW: 12.3 % (ref 11.7–15.4)
WBC: 5.9 10*3/uL (ref 3.4–10.8)

## 2020-03-30 LAB — COMPREHENSIVE METABOLIC PANEL
ALT: 8 IU/L (ref 0–32)
AST: 10 IU/L (ref 0–40)
Albumin/Globulin Ratio: 2 (ref 1.2–2.2)
Albumin: 4.5 g/dL (ref 3.8–4.8)
Alkaline Phosphatase: 69 IU/L (ref 48–121)
BUN/Creatinine Ratio: 8 — ABNORMAL LOW (ref 9–23)
BUN: 8 mg/dL (ref 6–24)
Bilirubin Total: 0.3 mg/dL (ref 0.0–1.2)
CO2: 26 mmol/L (ref 20–29)
Calcium: 9.2 mg/dL (ref 8.7–10.2)
Chloride: 99 mmol/L (ref 96–106)
Creatinine, Ser: 0.95 mg/dL (ref 0.57–1.00)
GFR calc Af Amer: 83 mL/min/{1.73_m2} (ref >59)
GFR calc non Af Amer: 72 mL/min/{1.73_m2} (ref >59)
Globulin, Total: 2.3 g/dL (ref 1.5–4.5)
Glucose: 88 mg/dL (ref 65–99)
Potassium: 3.8 mmol/L (ref 3.5–5.2)
Sodium: 140 mmol/L (ref 134–144)
Total Protein: 6.8 g/dL (ref 6.0–8.5)

## 2020-03-30 LAB — LIPID PANEL
Chol/HDL Ratio: 2.9 ratio (ref 0.0–4.4)
Cholesterol, Total: 180 mg/dL (ref 100–199)
HDL: 63 mg/dL (ref >39)
LDL Chol Calc (NIH): 103 mg/dL — ABNORMAL HIGH (ref 0–99)
Triglycerides: 73 mg/dL (ref 0–149)
VLDL Cholesterol Cal: 14 mg/dL (ref 5–40)

## 2020-03-30 LAB — VITAMIN D 25 HYDROXY (VIT D DEFICIENCY, FRACTURES): Vit D, 25-Hydroxy: 70 ng/mL (ref 30.0–100.0)

## 2020-03-30 LAB — HIV ANTIBODY (ROUTINE TESTING W REFLEX): HIV Screen 4th Generation wRfx: NONREACTIVE

## 2020-03-30 LAB — HEPATITIS C ANTIBODY: Hep C Virus Ab: <0.1 s/co ratio (ref 0.0–0.9)

## 2020-03-30 LAB — COLOGUARD: Cologuard: NEGATIVE

## 2020-03-30 LAB — HEMOGLOBIN A1C
Est. average glucose Bld gHb Est-mCnc: 97 mg/dL
Hgb A1c MFr Bld: 5 % (ref 4.8–5.6)

## 2020-03-30 LAB — TSH: TSH: 1.92 u[IU]/mL (ref 0.450–4.500)

## 2020-04-04 LAB — COLOGUARD: COLOGUARD: NEGATIVE

## 2020-04-04 LAB — EXTERNAL GENERIC LAB PROCEDURE: COLOGUARD: NEGATIVE

## 2020-04-08 ENCOUNTER — Encounter: Payer: Self-pay | Admitting: Family Medicine

## 2020-04-17 ENCOUNTER — Other Ambulatory Visit: Payer: Self-pay | Admitting: Family Medicine

## 2020-04-17 DIAGNOSIS — F411 Generalized anxiety disorder: Secondary | ICD-10-CM

## 2020-04-17 NOTE — Telephone Encounter (Signed)
Requested Prescriptions  Pending Prescriptions Disp Refills   busPIRone (BUSPAR) 7.5 MG tablet [Pharmacy Med Name: BUSPIRONE 7.5MG TABLETS] 180 tablet 1    Sig: TAKE 1 TABLET(7.5 MG) BY MOUTH TWICE DAILY AS NEEDED     Psychiatry: Anxiolytics/Hypnotics - Non-controlled Passed - 04/17/2020  3:15 AM      Passed - Valid encounter within last 6 months    Recent Outpatient Visits          1 month ago Annual physical exam   [place], DO   9 months ago Recurrent depressive disorder, current episode moderate ([place])   [place], DO   1 year ago Annual physical exam   [place], DO   1 year ago Chronic migraine with aura   [facility] Olin Hauser, DO   1 year ago GAD (generalized anxiety disorder)   [place], DO      Future Appointments            In 11 months [place] Ranger, Devonne Doughty, DO [facility]

## 2020-05-19 ENCOUNTER — Other Ambulatory Visit: Payer: Self-pay

## 2020-05-19 ENCOUNTER — Encounter: Payer: Self-pay | Admitting: Certified Nurse Midwife

## 2020-05-19 ENCOUNTER — Ambulatory Visit (INDEPENDENT_AMBULATORY_CARE_PROVIDER_SITE_OTHER): Payer: Managed Care, Other (non HMO) | Admitting: Certified Nurse Midwife

## 2020-05-19 VITALS — BP 128/87 | HR 77 | Ht 62.0 in | Wt 107.5 lb

## 2020-05-19 DIAGNOSIS — Z01419 Encounter for gynecological examination (general) (routine) without abnormal findings: Secondary | ICD-10-CM | POA: Diagnosis not present

## 2020-05-19 DIAGNOSIS — E8940 Asymptomatic postprocedural ovarian failure: Secondary | ICD-10-CM | POA: Diagnosis not present

## 2020-05-19 DIAGNOSIS — Z23 Encounter for immunization: Secondary | ICD-10-CM | POA: Diagnosis not present

## 2020-05-19 MED ORDER — ESTRADIOL 1 MG PO TABS: 1.0000 mg | ORAL_TABLET | Freq: Every day | ORAL | 3 refills | Status: DC

## 2020-05-19 NOTE — Progress Notes (Signed)
GYNECOLOGY ANNUAL PREVENTATIVE CARE ENCOUNTER NOTE  History:     Carmen Logan is a 49 y.o. G21P2002 female here for a routine annual gynecologic exam.  Current complaints: none.   Denies abnormal vaginal bleeding, discharge, pelvic pain, problems with intercourse or other gynecologic concerns.     Social Relationship:Married 2e yrs Living: with her spouse Work:[employer] Exercise: none Smoke/Alcohol/drug IYM:EBRA alcohol.   Gynecologic History No LMP recorded. Patient has had a hysterectomy. Contraception: status post hysterectomy Last Pap: 2016. Results were: normal with negative HPV Last mammogram: 06/2017. Results were: normal  Obstetric History OB History  Gravida Para Term Preterm AB Living  2 2 2     2   SAB TAB Ectopic Multiple Live Births          2    # Outcome Date GA Lbr Len/2nd Weight Sex Delivery Anes PTL Lv  2 Term 1999   6 lb 14.4 oz (3.13 kg) F Vag-Spont   LIV  1 Term 1995   6 lb 4.8 oz (2.858 kg) F Vag-Spont   LIV    Past Medical History:  Diagnosis Date   Anxiety    Cervical dysplasia    Depression    Dyspareunia, female    Endometriosis    Facial skin lesion    HPV test positive    Migraine    Surgical menopause     Past Surgical History:  Procedure Laterality Date   CERVICAL DISCECTOMY  03/17/2014   w/ fusion and plating   CRYOTHERAPY     cervix   VAGINAL HYSTERECTOMY  10/2009   Total vaginal hysterectomy, lso    Current Outpatient Medications on File Prior to Visit  Medication Sig Dispense Refill   busPIRone (BUSPAR) 7.5 MG tablet TAKE 1 TABLET(7.5 MG) BY MOUTH TWICE DAILY AS NEEDED 180 tablet 1   butalbital-acetaminophen-caffeine (FIORICET WITH CODEINE) 50-325-40-30 MG capsule TAKE 1 OR 2 CAPSULES BY MOUTH EVERY 4 HOURS AS NEEDED FOR HEADACHE OR MIGRAINE, NO MORE THAN 6 CAPSULES DAILY 30 capsule 1   calcium-vitamin D (OSCAL WITH D) 250-125 MG-UNIT tablet Take 1 tablet by mouth daily.     estradiol (ESTRACE) 1  MG tablet Take 1 tablet (1 mg total) by mouth daily. 90 tablet 0   sertraline (ZOLOFT) 50 MG tablet Take 1 tablet (50 mg total) by mouth daily. 90 tablet 3   traZODone (DESYREL) 50 MG tablet Take 1 tablet (50 mg total) by mouth at bedtime. 90 tablet 3   MELATONIN PO Take 12 mg by mouth daily.     omeprazole (PRILOSEC) 20 MG capsule Take 20 mg by mouth as needed.      No current facility-administered medications on file prior to visit.    No Known Allergies  Social History:  reports that she has quit smoking. Her smoking use included cigarettes. She has quit using smokeless tobacco. She reports current alcohol use of about 2.0 standard drinks of alcohol per week. She reports that she does not use drugs.  Family History  Problem Relation Age of Onset   Heart disease Mother    Hypertension Mother    Thyroid disease Mother    Heart disease Father    Heart attack Father 35   Diabetes Maternal Aunt    Diabetes Maternal Grandmother    Depression Sister    Cancer Neg Hx     The following portions of the patient's history were reviewed and updated as appropriate: allergies, current medications, past family  history, past medical history, past social history, past surgical history and problem list.  Review of Systems Pertinent items noted in HPI and remainder of comprehensive ROS otherwise negative.  Physical Exam:  BP 128/87    Pulse 77    Ht 5' 2"  (1.575 m)    Wt 107 lb 8 oz (48.8 kg)    BMI 19.66 kg/m  CONSTITUTIONAL: Well-developed, well-nourished female in no acute distress.  HENT:  Normocephalic, atraumatic, External right and left ear normal. Oropharynx is clear and moist EYES: Conjunctivae and EOM are normal. Pupils are equal, round, and reactive to light. No scleral icterus.  NECK: Normal range of motion, supple, no masses.  Normal thyroid.  SKIN: Skin is warm and dry. No rash noted. Not diaphoretic. No erythema. No pallor. MUSCULOSKELETAL: Normal range of motion. No  tenderness.  No cyanosis, clubbing, or edema.  2+ distal pulses. NEUROLOGIC: Alert and oriented to person, place, and time. Normal reflexes, muscle tone coordination.  PSYCHIATRIC: Normal mood and affect. Normal behavior. Normal judgment and thought content. CARDIOVASCULAR: Normal heart rate noted, regular rhythm RESPIRATORY: Clear to auscultation bilaterally. Effort and breath sounds normal, no problems with respiration noted. BREASTS: Symmetric in size. No masses, tenderness, skin changes, nipple drainage, or lymphadenopathy bilaterally.  ABDOMEN: Soft, no distention noted.  No tenderness, rebound or guarding.  PELVIC: Normal appearing external genitalia and urethral meatus; normal appearing vaginal mucosa. No abnormal discharge noted.Marland Kitchen Uterine -absent , no other palpable masses,  or adnexal tenderness.  .   Assessment and Plan:    1. Women's annual routine gynecological examination   Pap:n/a  Mammogram :ordered by her PCP Labs:none Refills:Estridiol  Referral:none Routine preventative health maintenance measures emphasized. Please refer to After Visit Summary for other counseling recommendations.      Philip Aspen, CNM [facility]

## 2020-05-19 NOTE — Patient Instructions (Signed)

## 2020-06-04 ENCOUNTER — Other Ambulatory Visit: Payer: Self-pay | Admitting: Family Medicine

## 2020-06-04 DIAGNOSIS — F3340 Major depressive disorder, recurrent, in remission, unspecified: Secondary | ICD-10-CM

## 2020-06-08 ENCOUNTER — Ambulatory Visit: Payer: Self-pay | Admitting: Family Medicine

## 2020-06-08 NOTE — Telephone Encounter (Signed)
Pt reports "Passed out twice yesterday." States occurred when got out of bed to go to BR, then when standing, getting off commode. Did not strain. States husband witnessed, "Out for 1-2 seconds only."  States BP at that time 64/46. Is not on any BP meds, no new meds. Took Fioricet over a month ago.  Reports dizziness, remains this AM, has to hold onto things to ambulate. Reports BP this AM 86/61 HR 66. States HR has been as low as 50's. No injuries from fall. Slight headache. States has been staying hydrated.  Advised ED, states will follow disposition, husband will drive.  Reason for Disposition . [1] Fainted > 15 minutes ago AND [2] still feels weak or dizzy  Answer Assessment - Initial Assessment Questions 1. ONSET: "How long were you unconscious?" (minutes) "When did it happen?"     *Seconds, occurred yesterday 2. CONTENT: "What happened during period of unconsciousness?" (e.g., seizure activity)      *BP low 3. MENTAL STATUS: "Alert and oriented now?" (oriented x 3 = name, month, location)      *yes 4. TRIGGER: "What do you think caused the fainting?" "What were you doing just before you fainted?"  (e.g., exercise, sudden standing up, prolonged standing)     *BP 64/46 5. RECURRENT SYMPTOM: "Have you ever passed out before?" If Yes, ask: "When was the last time?" and "What happened that time?"      no 6. INJURY: "Did you sustain any injury during the fall?"      no 7. CARDIAC SYMPTOMS: "Have you had any of the following symptoms: chest pain, difficulty breathing, palpitations?"     No . HR 50s at times 8. NEUROLOGIC SYMPTOMS: "Have you had any of the following symptoms: headache, numbness, vertigo, weakness?"     Slight HA, "No energy" dizziness 9. GI SYMPTOMS: "Have you had any of the following symptoms: abdominal pain, vomiting, diarrhea, blood in stools?"     no 10. OTHER SYMPTOMS: "Do you have any other symptoms?"       dizzy 11. PREGNANCY: "Is there any chance you are pregnant?"  "When was your l  Protocols used: [facility]

## 2020-06-10 ENCOUNTER — Telehealth: Payer: Self-pay

## 2020-06-10 DIAGNOSIS — R7989 Other specified abnormal findings of blood chemistry: Secondary | ICD-10-CM

## 2020-06-10 NOTE — Telephone Encounter (Signed)
Copied from [place] [id number]. Topic: General - Other >> Jun 10, 2020 10:02 AM Alanda Slim E wrote: Reason for CRM: Pt asked if she will have to do blood work for her Tuesday appt and if so can orders be sent before appt date/ please advise

## 2020-06-10 NOTE — Telephone Encounter (Signed)
Patient has appointment on Tuesday around 2:40 pm and was told by ED from [facility] that she might need repeat lab work since her liver function was abnormal and frequent syncope episode, she would like to know if Dr Raliegh Ip wants her to have any prior blood work before appointment ?

## 2020-06-10 NOTE — Telephone Encounter (Signed)
Yes that will be fine. Orders in now for repeat CMET and added acute Hepatitis Panel. She can have them drawn tomorrow Friday or Monday preferred. If needs to get drawn on Tuesday that is fine we may not have results back though by her apt.  Nobie Putnam, [facility] 06/10/2020, 2:56 PM

## 2020-06-10 NOTE — Telephone Encounter (Signed)
Notified patient She requested for [employer] is changed.

## 2020-06-11 NOTE — Telephone Encounter (Signed)
Lab order

## 2020-06-15 ENCOUNTER — Ambulatory Visit (INDEPENDENT_AMBULATORY_CARE_PROVIDER_SITE_OTHER): Payer: Managed Care, Other (non HMO) | Admitting: Family Medicine

## 2020-06-15 ENCOUNTER — Other Ambulatory Visit: Payer: Self-pay

## 2020-06-15 ENCOUNTER — Other Ambulatory Visit: Payer: Self-pay | Admitting: Family Medicine

## 2020-06-15 ENCOUNTER — Encounter: Payer: Self-pay | Admitting: Family Medicine

## 2020-06-15 VITALS — BP 94/56 | HR 71 | Temp 98.2°F | Resp 16 | Ht 62.0 in | Wt 107.6 lb

## 2020-06-15 DIAGNOSIS — R55 Syncope and collapse: Secondary | ICD-10-CM | POA: Diagnosis not present

## 2020-06-15 DIAGNOSIS — G43109 Migraine with aura, not intractable, without status migrainosus: Secondary | ICD-10-CM

## 2020-06-15 DIAGNOSIS — E861 Hypovolemia: Secondary | ICD-10-CM

## 2020-06-15 DIAGNOSIS — I9589 Other hypotension: Secondary | ICD-10-CM | POA: Diagnosis not present

## 2020-06-15 DIAGNOSIS — R7989 Other specified abnormal findings of blood chemistry: Secondary | ICD-10-CM | POA: Diagnosis not present

## 2020-06-15 DIAGNOSIS — R634 Abnormal weight loss: Secondary | ICD-10-CM | POA: Diagnosis not present

## 2020-06-15 LAB — COMPREHENSIVE METABOLIC PANEL
ALT: 92 IU/L — ABNORMAL HIGH (ref 0–32)
AST: 45 IU/L — ABNORMAL HIGH (ref 0–40)
Albumin/Globulin Ratio: 1.8 (ref 1.2–2.2)
Albumin: 4.4 g/dL (ref 3.8–4.8)
Alkaline Phosphatase: 70 IU/L (ref 44–121)
BUN/Creatinine Ratio: 9 (ref 9–23)
BUN: 9 mg/dL (ref 6–24)
Bilirubin Total: 0.3 mg/dL (ref 0.0–1.2)
CO2: 25 mmol/L (ref 20–29)
Calcium: 9.3 mg/dL (ref 8.7–10.2)
Chloride: 98 mmol/L (ref 96–106)
Creatinine, Ser: 1.04 mg/dL — ABNORMAL HIGH (ref 0.57–1.00)
GFR calc Af Amer: 74 mL/min/{1.73_m2} (ref >59)
GFR calc non Af Amer: 64 mL/min/{1.73_m2} (ref >59)
Globulin, Total: 2.5 g/dL (ref 1.5–4.5)
Glucose: 137 mg/dL — ABNORMAL HIGH (ref 65–99)
Potassium: 4 mmol/L (ref 3.5–5.2)
Sodium: 138 mmol/L (ref 134–144)
Total Protein: 6.9 g/dL (ref 6.0–8.5)

## 2020-06-15 LAB — HEPATITIS PANEL, ACUTE
Hep A IgM: NEGATIVE
Hep B C IgM: NEGATIVE
Hep C Virus Ab: <0.1 s/co ratio (ref 0.0–0.9)
Hepatitis B Surface Ag: NEGATIVE

## 2020-06-15 MED ORDER — BUTALBITAL-APAP-CAFF-COD 50-325-40-30 MG PO CAPS: ORAL_CAPSULE | ORAL | 1 refills | Status: DC

## 2020-06-15 NOTE — Patient Instructions (Addendum)
Thank you for coming to the office today.  Keep improving hydration, nutrition, caution with standing.  Talk to heart doctor about - hypotension, orthostatic hypotension as well with standing, and your lightheaded/dizziness symptoms, - I think it is all due to loss of weight and fluid status / affecting your blood pressure.  Your liver enzymes have improved. I think this was due to "temporary liver damage due to low blood pressure", we can check again in future. No other concerning findings.  If they are unable to identify the cause and your symptoms return, we can discuss again about broad work up with imaging and other testing.  Please schedule a Follow-up Appointment to: Return in about 6 weeks (around 07/27/2020) for 6 week follow-up with me as needed for Hypotension, Weight loss.  If you have any other questions or concerns, please feel free to call the office or send a message through [place]. You may also schedule an earlier appointment if necessary.  Additionally, you may be receiving a survey about your experience at our office within a few days to 1 week by e-mail or mail. We value your feedback.  Nobie Putnam, DO [place]

## 2020-06-15 NOTE — Progress Notes (Signed)
Subjective:    Patient ID: Carmen Logan, female    DOB: 06-23-1971, 49 y.o.   MRN: [id number]  Carmen Logan is a 49 y.o. female presenting on 06/15/2020 for Hospitalization Follow-up (hypotension, syncope)   HPI  ED FOLLOW-UP VISIT  Hospital/Location: [facility] Date of ED Visit: 06/08/20  Reason for Presenting to ED: syncopal episodes Primary (+Secondary) Diagnosis: vasovagal syncope, recurrent, hypotension, dehydration  FOLLOW-UP  - ED provider note and record have been reviewed - Patient presents today about 7 days after recent ED visit. Brief summary of recent course, patient had symptoms of sudden new onset syncopal episodes x 2 within few minutes between, she works 3rd shift at night at home, she went to bathroom and passed out on the way to bathroom and then again after bathroom, was found to find hypotension BP 64/46. She had some coffee, and she felt internally shaky and weaker but not normal, worked next day, she called in triage and they advised going to [facility] ED. She was taken to [facility] ED at [facility] had work up with  >6 months had had gradual unintentional weight loss. Seems to have plateau at 107 lbs, previously avg 127 lbs. She eats regular meals, not skipping, often eating some fast food, some snacking. She admits poor fluid intake. She admits working increased hours at [employer], short staffed, says she drinks maybe 40 oz of water a day. She drinks a cup of coffee daily or 16 oz pepsi for caffeine in day.  She admits still some residual lightheadedness, dizziness that is present at times.  Home avg BP 90-100/50-60s  Glucose 137 non fasting.  LFT dramatically improved from AST 129, ALT 240 (in ED, 06/08/20), then improved to AST 45, ALT 92.  Now drinking now OTC 2 liquid IV rehydration drinks a day with rehydration.  Today now feels about 80% better but still not resolved. Still some lightheadedness at times.   I have reviewed the  discharge medication list, and have reconciled the current and discharge medications today.   Current Outpatient Medications:  .  busPIRone (BUSPAR) 7.5 MG tablet, TAKE 1 TABLET(7.5 MG) BY MOUTH TWICE DAILY AS NEEDED, Disp: 180 tablet, Rfl: 1 .  butalbital-acetaminophen-caffeine (FIORICET WITH CODEINE) 50-325-40-30 MG capsule, TAKE 1 OR 2 CAPSULES BY MOUTH EVERY 4 HOURS AS NEEDED FOR HEADACHE OR MIGRAINE, NO MORE THAN 6 CAPSULES DAILY, Disp: 30 capsule, Rfl: 1 .  calcium-vitamin D (OSCAL WITH D) 250-125 MG-UNIT tablet, Take 1 tablet by mouth daily., Disp: , Rfl:  .  estradiol (ESTRACE) 1 MG tablet, Take 1 tablet (1 mg total) by mouth daily., Disp: 90 tablet, Rfl: 3 .  sertraline (ZOLOFT) 50 MG tablet, Take 1 tablet (50 mg total) by mouth daily., Disp: 90 tablet, Rfl: 3 .  traZODone (DESYREL) 50 MG tablet, TAKE 1 TABLET BY MOUTH EVERY NIGHT AT BEDTIME. MAY START WITH 1/2 TABLET EVERY NIGHT AT BEDTIME IF PREFERRED, Disp: 90 tablet, Rfl: 1  ------------------------------------------------------------------------- Social History   Tobacco Use  . Smoking status: Former Smoker    Types: Cigarettes  . Smokeless tobacco: Former Systems developer  . Tobacco comment: Unsure duration  Vaping Use  . Vaping Use: Never used  Substance Use Topics  . Alcohol use: Yes    Comment: occasional  . Drug use: No    Review of Systems Per HPI unless specifically indicated above     Objective:    BP (!) 94/56   Pulse 71   Temp 98.2 F (36.8  C) (Temporal)   Resp 16   Ht 5' 2"  (1.575 m)   Wt 107 lb 9.6 oz (48.8 kg)   SpO2 98%   BMI 19.68 kg/m   Wt Readings from Last 3 Encounters:  06/15/20 107 lb 9.6 oz (48.8 kg)  05/19/20 107 lb 8 oz (48.8 kg)  03/08/20 116 lb 12.8 oz (53 kg)    Physical Exam Vitals and nursing note reviewed.  Constitutional:      General: She is not in acute distress.    Appearance: She is well-developed. She is not diaphoretic.     Comments: Thin appearing with weight loss, today is  comfortable, cooperative  HENT:     Head: Normocephalic and atraumatic.  Eyes:     General:        Right eye: No discharge.        Left eye: No discharge.     Conjunctiva/sclera: Conjunctivae normal.  Neck:     Thyroid: No thyromegaly.  Cardiovascular:     Rate and Rhythm: Normal rate and regular rhythm.     Heart sounds: Normal heart sounds. No murmur heard.   Pulmonary:     Effort: Pulmonary effort is normal. No respiratory distress.     Breath sounds: Normal breath sounds. No wheezing or rales.  Musculoskeletal:        General: Normal range of motion.     Cervical back: Normal range of motion and neck supple.     Comments: Mild general muscle atrophy upper extremity  Lymphadenopathy:     Cervical: No cervical adenopathy.  Skin:    General: Skin is warm and dry.     Findings: No erythema or rash.  Neurological:     Mental Status: She is alert and oriented to person, place, and time.  Psychiatric:        Behavior: Behavior normal.     Comments: Well groomed, good eye contact, normal speech and thoughts       ED [facility] [place] Protocol Emergency  Narrative Performed by [facility] This result has an attachment that is not available.  Limited Cardiac Ultrasound (CPT [id number])   Indication:  A focused ultrasound exam of the heart was performed to evaluate for  pericardial effusion, tamponade, severe hypovolemia, or gross  abnormalities of cardiac anatomy or function in this patient.The  ultrasound was performed with the following indications, as noted in the  H&P: Syncope   Identified structures:  The pericardial sac, myocardium, and 4 chambers were identified using the  following views: subxiphoid, parasternal long axis, parasternal short  axis, apical 4-chamber and IVC (long axis)   Findings:  Exam of the above structures revealed the following findings:   Pericardial effusion: Absent   Pericardial tamponade: N/A  Global LV function: Normal  Right  ventricular size: Normal  Signs of RV strain: N/A  IVC: Normal    Other findings:   Limitations: None.   Impression:   No sonographic evidence of significant cardiac dysfunction, No sonographic  evidence of significant pericardial effusion, Normal RV andNo sonographic  evidence of volume depletion   Other:   Interpreted by: Nilda Riggs, MD Specimen Collected: -- Last Resulted: 06/08/20 4:09 PM  Received From: [place]  Result Received: 06/10/20 2:39 PM     Results for orders placed or performed in visit on 06/10/20  Comprehensive Metabolic Panel (CMET)  Result Value Ref Range   Glucose 137 (H) 65 - 99 mg/dL   BUN 9 6 - 24 mg/dL  Creatinine, Ser 1.04 (H) 0.57 - 1.00 mg/dL   GFR calc non Af Amer 64 >59 mL/min/1.73   GFR calc Af Amer 74 >59 mL/min/1.73   BUN/Creatinine Ratio 9 9 - 23   Sodium 138 134 - 144 mmol/L   Potassium 4.0 3.5 - 5.2 mmol/L   Chloride 98 96 - 106 mmol/L   CO2 25 20 - 29 mmol/L   Calcium 9.3 8.7 - 10.2 mg/dL   Total Protein 6.9 6.0 - 8.5 g/dL   Albumin 4.4 3.8 - 4.8 g/dL   Globulin, Total 2.5 1.5 - 4.5 g/dL   Albumin/Globulin Ratio 1.8 1.2 - 2.2   Bilirubin Total 0.3 0.0 - 1.2 mg/dL   Alkaline Phosphatase 70 44 - 121 IU/L   AST 45 (H) 0 - 40 IU/L   ALT 92 (H) 0 - 32 IU/L  Hepatitis, Acute  Result Value Ref Range   Hep A IgM Negative Negative   Hepatitis B Surface Ag Negative Negative   Hep B C IgM Negative Negative   Hep C Virus Ab <0.1 0.0 - 0.9 s/co ratio      Assessment & Plan:   Problem List Items Addressed This Visit    None    Visit Diagnoses    Vasovagal syncope    -  Primary   Hypotension due to hypovolemia       Unintentional weight loss       Elevated LFTs         Clinically most consistent with constellation of factors causing repeat syncopal episodes at that one time, history and ED visit suggestive of hypotension, hypovolemia, dehydration, and weight loss gradually, reviewed work up from [facility] ED - Now much improved but not at baseline yet - Weight stable but not significant increase, improving diet PO as instructed - Improving rehydration fluid intake - LFTs significantly improved - likely due to hypotension poor perfusion  Should follow up with [facility] within 7-10 days as scheduled already discuss hypotension/syncope and further work up.  Discussion today that I am concerned about her weight loss but ultimately she provides history that no other symptoms mostly reduced PO intake and long work hours affecting her, and we will monitor closely, if cardiology rules out her heart and still has concerns she can return promptly for broader work up on unintentional weight loss   No orders of the defined types were placed in this encounter.   Follow up plan: Return in about 6 weeks (around 07/27/2020) for 6 week follow-up with me as needed for Hypotension, Weight loss.  Nobie Putnam, DO [facility] 06/15/2020, 3:15 PM

## 2020-06-16 ENCOUNTER — Other Ambulatory Visit: Payer: Self-pay | Admitting: Family Medicine

## 2020-06-16 ENCOUNTER — Other Ambulatory Visit: Payer: Self-pay

## 2020-06-16 DIAGNOSIS — Z1231 Encounter for screening mammogram for malignant neoplasm of breast: Secondary | ICD-10-CM

## 2020-07-05 ENCOUNTER — Ambulatory Visit
Admission: RE | Admit: 2020-07-05 | Discharge: 2020-07-05 | Disposition: A | Discharge status: Home / Self Care | Payer: Managed Care, Other (non HMO) | Source: Ambulatory Visit | Attending: Family Medicine | Admitting: Family Medicine

## 2020-07-05 ENCOUNTER — Other Ambulatory Visit: Payer: Self-pay

## 2020-07-05 DIAGNOSIS — Z1231 Encounter for screening mammogram for malignant neoplasm of breast: Secondary | ICD-10-CM | POA: Insufficient documentation

## 2020-07-06 ENCOUNTER — Other Ambulatory Visit: Payer: Self-pay | Admitting: Family Medicine

## 2020-07-06 DIAGNOSIS — R928 Other abnormal and inconclusive findings on diagnostic imaging of breast: Secondary | ICD-10-CM

## 2020-07-06 DIAGNOSIS — N6489 Other specified disorders of breast: Secondary | ICD-10-CM

## 2020-07-16 ENCOUNTER — Ambulatory Visit
Admission: RE | Admit: 2020-07-16 | Discharge: 2020-07-16 | Disposition: A | Discharge status: Home / Self Care | Payer: Managed Care, Other (non HMO) | Source: Ambulatory Visit | Attending: Family Medicine | Admitting: Family Medicine

## 2020-07-16 ENCOUNTER — Other Ambulatory Visit: Payer: Self-pay

## 2020-07-16 DIAGNOSIS — R928 Other abnormal and inconclusive findings on diagnostic imaging of breast: Secondary | ICD-10-CM

## 2020-07-16 DIAGNOSIS — N6489 Other specified disorders of breast: Secondary | ICD-10-CM

## 2020-07-19 ENCOUNTER — Other Ambulatory Visit: Payer: Self-pay

## 2020-08-11 DIAGNOSIS — G43109 Migraine with aura, not intractable, without status migrainosus: Secondary | ICD-10-CM

## 2020-08-11 MED ORDER — BUTALBITAL-APAP-CAFF-COD 50-325-40-30 MG PO CAPS: ORAL_CAPSULE | ORAL | 1 refills | Status: DC

## 2020-10-19 ENCOUNTER — Ambulatory Visit (INDEPENDENT_AMBULATORY_CARE_PROVIDER_SITE_OTHER): Payer: Managed Care, Other (non HMO) | Admitting: Family Medicine

## 2020-10-19 ENCOUNTER — Other Ambulatory Visit: Payer: Self-pay | Admitting: Family Medicine

## 2020-10-19 ENCOUNTER — Encounter: Payer: Self-pay | Admitting: Family Medicine

## 2020-10-19 ENCOUNTER — Other Ambulatory Visit: Payer: Self-pay

## 2020-10-19 VITALS — BP 131/82 | HR 63 | Ht 62.0 in | Wt 95.2 lb

## 2020-10-19 DIAGNOSIS — R110 Nausea: Secondary | ICD-10-CM

## 2020-10-19 DIAGNOSIS — K909 Intestinal malabsorption, unspecified: Secondary | ICD-10-CM | POA: Diagnosis not present

## 2020-10-19 DIAGNOSIS — M2550 Pain in unspecified joint: Secondary | ICD-10-CM | POA: Diagnosis not present

## 2020-10-19 DIAGNOSIS — R634 Abnormal weight loss: Secondary | ICD-10-CM | POA: Diagnosis not present

## 2020-10-19 DIAGNOSIS — R197 Diarrhea, unspecified: Secondary | ICD-10-CM

## 2020-10-19 DIAGNOSIS — G43109 Migraine with aura, not intractable, without status migrainosus: Secondary | ICD-10-CM

## 2020-10-19 DIAGNOSIS — F331 Major depressive disorder, recurrent, moderate: Secondary | ICD-10-CM

## 2020-10-19 MED ORDER — SERTRALINE HCL 50 MG PO TABS: 75.0000 mg | ORAL_TABLET | Freq: Every day | ORAL | 3 refills | Status: DC

## 2020-10-19 MED ORDER — BUTALBITAL-APAP-CAFF-COD 50-325-40-30 MG PO CAPS: ORAL_CAPSULE | ORAL | 2 refills | Status: DC

## 2020-10-19 MED ORDER — ONDANSETRON 4 MG PO TBDP: 4.0000 mg | ORAL_TABLET | Freq: Three times a day (TID) | ORAL | 0 refills | Status: DC | PRN

## 2020-10-19 NOTE — Progress Notes (Signed)
Subjective:    Patient ID: Carmen Logan, female    DOB: [birth year], 50 y.o.   MRN: [id number]  Carmen Logan is a 50 y.o. female presenting on 10/19/2020 for Weight Loss and Anorexia (Loss of appetite)   HPI   Weight Loss / Reduced Appetite Overview of past weight trend, initially in 02/2019 she weighed 134 lbs approximately, then in 1 year to 02/2020 down about 18-20 lbs, unintentionally, avg weight then was around 115 lbs. Then in about 2-4 months, in Fall 2021 down another 10 lbs, to avg 105-107 lbs, then another 4-5 months now to 09/2020 down to 95 lbs (down another 10 lbs), similar to her scales at home. - She describes feeling nauseas and poor appetite, not interested in food, she did not have vomiting but could not keep in the calories. - She did have a GI stomach virus in 07/2020, caused nausea vomiting diarrhea - Now since January 2022, She feels like her appetite has improved, and has returned. She now eats more regularly, and she works from home and able to eat regularly. She has purchased protein shakes, body builder supplement. She is eating variety in her diet, carbs, fruits, and makes a fruit smoothie cocktail. Mostly plant based with beans and lentils. She does get protein mostly from supplement with shakes, occasionally she will eat a steak. - She is sedentary at home working from home, can get some tingling episodes - She can feel symptoms of   Health Maintenance: History of Cologuard negative 03/30/20, next due in 3 years or 03/2023  Depression screen [facility] 2/9 03/08/2020 07/04/2019 03/03/2019  Decreased Interest 0 3 0  Down, Depressed, Hopeless 0 2 0  PHQ - 2 Score 0 5 0  Altered sleeping 0 3 0  Tired, decreased energy 0 3 0  Change in appetite 0 3 0  Feeling bad or failure about yourself  0 3 0  Trouble concentrating 0 2 0  Moving slowly or fidgety/restless 0 0 0  Suicidal thoughts 0 0 0  PHQ-9 Score 0 19 0  Difficult doing work/chores Not difficult at all Somewhat  difficult Not difficult at all    Social History   Tobacco Use  . Smoking status: Former Smoker    Types: Cigarettes  . Smokeless tobacco: Former Systems developer  . Tobacco comment: Unsure duration  Vaping Use  . Vaping Use: Never used  Substance Use Topics  . Alcohol use: Yes    Comment: occasional  . Drug use: No    Review of Systems Per HPI unless specifically indicated above     Objective:    BP 131/82   Pulse 63   Ht 5' 2"  (1.575 m)   Wt 95 lb 3.2 oz (43.2 kg)   SpO2 100%   BMI 17.41 kg/m   Wt Readings from Last 3 Encounters:  10/19/20 95 lb 3.2 oz (43.2 kg)  06/15/20 107 lb 9.6 oz (48.8 kg)  05/19/20 107 lb 8 oz (48.8 kg)    Physical Exam Vitals and nursing note reviewed.  Constitutional:      General: She is not in acute distress.    Appearance: She is well-developed and well-nourished. She is not diaphoretic.     Comments: Thin but still currently well-appearing, comfortable, cooperative  HENT:     Head: Normocephalic and atraumatic.     Mouth/Throat:     Mouth: Oropharynx is clear and moist.  Eyes:     General:  Right eye: No discharge.        Left eye: No discharge.     Conjunctiva/sclera: Conjunctivae normal.  Neck:     Thyroid: No thyromegaly.  Cardiovascular:     Rate and Rhythm: Normal rate and regular rhythm.     Pulses: Intact distal pulses.     Heart sounds: Normal heart sounds. No murmur heard.   Pulmonary:     Effort: Pulmonary effort is normal. No respiratory distress.     Breath sounds: Normal breath sounds. No wheezing or rales.  Abdominal:     General: Bowel sounds are normal. There is no distension.     Palpations: Abdomen is soft. There is no mass.     Tenderness: There is no abdominal tenderness.  Musculoskeletal:        General: No edema. Normal range of motion.     Cervical back: Normal range of motion and neck supple.  Lymphadenopathy:     Cervical: No cervical adenopathy.  Skin:    General: Skin is warm and dry.      Findings: No erythema or rash.  Neurological:     Mental Status: She is alert and oriented to person, place, and time.  Psychiatric:        Mood and Affect: Mood and affect normal.        Behavior: Behavior normal.     Comments: Well groomed, good eye contact, normal speech and thoughts       Results for orders placed or performed in visit on 06/10/20  Comprehensive Metabolic Panel (CMET)  Result Value Ref Range   Glucose 137 (H) 65 - 99 mg/dL   BUN 9 6 - 24 mg/dL   Creatinine, Ser 1.04 (H) 0.57 - 1.00 mg/dL   GFR calc non Af Amer 64 >59 mL/min/1.73   GFR calc Af Amer 74 >59 mL/min/1.73   BUN/Creatinine Ratio 9 9 - 23   Sodium 138 134 - 144 mmol/L   Potassium 4.0 3.5 - 5.2 mmol/L   Chloride 98 96 - 106 mmol/L   CO2 25 20 - 29 mmol/L   Calcium 9.3 8.7 - 10.2 mg/dL   Total Protein 6.9 6.0 - 8.5 g/dL   Albumin 4.4 3.8 - 4.8 g/dL   Globulin, Total 2.5 1.5 - 4.5 g/dL   Albumin/Globulin Ratio 1.8 1.2 - 2.2   Bilirubin Total 0.3 0.0 - 1.2 mg/dL   Alkaline Phosphatase 70 44 - 121 IU/L   AST 45 (H) 0 - 40 IU/L   ALT 92 (H) 0 - 32 IU/L  Hepatitis, Acute  Result Value Ref Range   Hep A IgM Negative Negative   Hepatitis B Surface Ag Negative Negative   Hep B C IgM Negative Negative   Hep C Virus Ab <0.1 0.0 - 0.9 s/co ratio      Assessment & Plan:   Problem List Items Addressed This Visit   None   Visit Diagnoses    Unintentional weight loss    -  Primary   Relevant Orders   CBC with Differential/Platelet   Glia (IgA/G) + tTG IgA   Comprehensive metabolic panel   VITAMIN D 25 Hydroxy (Vit-D Deficiency, Fractures)   Vitamin B12   Thyroid Panel With TSH   Ambulatory referral to Gastroenterology   Diarrhea due to malabsorption       Relevant Orders   CBC with Differential/Platelet   Glia (IgA/G) + tTG IgA   Comprehensive metabolic panel   VITAMIN D 25 Hydroxy (Vit-D  Deficiency, Fractures)   Vitamin B12   Thyroid Panel With TSH   Ambulatory referral to Gastroenterology    Nausea       Relevant Medications   ondansetron (ZOFRAN ODT) 4 MG disintegrating tablet     Chronic unintentional weight loss 1.5 years Weight down from 135 lbs to 95 lbs approximately, overall up to 40 lbs since 02/2019. Recent loss 10 lbs in 4 months 105 to 95 lbs approx History suggests GI symptoms in past mixed symptoms some functional concerns, with early satiety and fullness, bloating, nausea vomiting diarrhea, has had GI illness viral in past as well 07/2020, now improved overall but still has weight loss.  Last extensive routine blood panel and physical 02/2020  Additionally with multiple areas of joint pain as well, affecting her function. Question if related to muscle mass loss and wt loss / weakness. Or if related to joint inflammation.  Regarding GI studies, she has had Cologuard back in 03/2020, negative. No colonoscopy prior.  Discussed today that we can offer a broad lab panel today - will draw extensive labs, ordered to [facility] printed, given to patient, also included inflammatory markers for possible autoimmune or inflammatory joint condition as well.  Referral entered for [place] for consultation of weight loss, and can review labs, we can follow up lab results also. She may benefit from future Colonoscopy, and or Advanced Imaging - CT if indicated.  Encourage to maintain nutrition intake as best as she can now symptoms somewhat improved.  Add Rx Zofran ODT PRN if needed only.   Orders Placed This Encounter  Procedures  . CBC with Differential/Platelet  . Glia (IgA/G) + tTG IgA  . Comprehensive metabolic panel    Order Specific Question:   Has the patient fasted?    Answer:   Yes  . VITAMIN D 25 Hydroxy (Vit-D Deficiency, Fractures)  . Vitamin B12  . Thyroid Panel With TSH  . Ambulatory referral to Gastroenterology    Referral Priority:   Routine    Referral Type:   Consultation    Referral Reason:   Specialty Services Required    Number of Visits  Requested:   1     Meds ordered this encounter  Medications  . ondansetron (ZOFRAN ODT) 4 MG disintegrating tablet    Sig: Take 1 tablet (4 mg total) by mouth every 8 (eight) hours as needed for nausea or vomiting.    Dispense:  30 tablet    Refill:  0     Follow up plan: Return if symptoms worsen or fail to improve.  Nobie Putnam, [facility] 10/19/2020, 2:14 PM

## 2020-10-19 NOTE — Patient Instructions (Addendum)
Thank you for coming to the office today.  [facility] [address], [place] [address] Phone: [phone]  [place] [facility] [address] [place], [place] [address] Main: [phone]  [facility] orders in.  GI specialist may discuss imaging like CT or other, maybe colonoscopy  Try Zofran for nausea as needed.  OTC Peppermint Oil (Triple Coated Capsule) 130m take one 3 times daily to reduce diarrhea   Please schedule a Follow-up Appointment to: Return if symptoms worsen or fail to improve.  If you have any other questions or concerns, please feel free to call the office or send a message through [place] You may also schedule an earlier appointment if necessary.  Additionally, you may be receiving a survey about your experience at our office within a few days to 1 week by e-mail or mail. We value your feedback.  ANobie Putnam DO [facility]

## 2020-10-21 LAB — ANA: Anti Nuclear Antibody (ANA): NEGATIVE

## 2020-10-21 LAB — SEDIMENTATION RATE: Sed Rate: 2 mm/hr (ref 0–32)

## 2020-10-21 LAB — RHEUMATOID FACTOR: Rheumatoid fact SerPl-aCnc: <10 IU/mL (ref <14.0)

## 2020-10-21 LAB — C-REACTIVE PROTEIN: CRP: <1 mg/L (ref 0–10)

## 2020-10-22 LAB — THYROID PANEL WITH TSH
Free Thyroxine Index: 2.4 (ref 1.2–4.9)
T3 Uptake Ratio: 24 % (ref 24–39)
T4, Total: 9.8 ug/dL (ref 4.5–12.0)
TSH: 3.23 u[IU]/mL (ref 0.450–4.500)

## 2020-10-22 LAB — CBC WITH DIFFERENTIAL/PLATELET
Basophils Absolute: 0 10*3/uL (ref 0.0–0.2)
Basos: 0 %
EOS (ABSOLUTE): 0 10*3/uL (ref 0.0–0.4)
Eos: 0 %
Hematocrit: 38.6 % (ref 34.0–46.6)
Hemoglobin: 13.1 g/dL (ref 11.1–15.9)
Immature Grans (Abs): 0 10*3/uL (ref 0.0–0.1)
Immature Granulocytes: 0 %
Lymphocytes Absolute: 1.5 10*3/uL (ref 0.7–3.1)
Lymphs: 20 %
MCH: 30 pg (ref 26.6–33.0)
MCHC: 33.9 g/dL (ref 31.5–35.7)
MCV: 88 fL (ref 79–97)
Monocytes Absolute: 0.4 10*3/uL (ref 0.1–0.9)
Monocytes: 5 %
Neutrophils Absolute: 5.5 10*3/uL (ref 1.4–7.0)
Neutrophils: 75 %
Platelets: 214 10*3/uL (ref 150–450)
RBC: 4.37 x10E6/uL (ref 3.77–5.28)
RDW: 13 % (ref 11.7–15.4)
WBC: 7.4 10*3/uL (ref 3.4–10.8)

## 2020-10-22 LAB — GLIA (IGA/G) + TTG IGA
Antigliadin Abs, IgA: 12 units (ref 0–19)
Gliadin IgG: 2 units (ref 0–19)
Transglutaminase IgA: <2 U/mL (ref 0–3)

## 2020-10-22 LAB — COMPREHENSIVE METABOLIC PANEL
ALT: 10 IU/L (ref 0–32)
AST: 15 IU/L (ref 0–40)
Albumin/Globulin Ratio: 1.8 (ref 1.2–2.2)
Albumin: 4.5 g/dL (ref 3.8–4.8)
Alkaline Phosphatase: 66 IU/L (ref 44–121)
BUN/Creatinine Ratio: 10 (ref 9–23)
BUN: 8 mg/dL (ref 6–24)
Bilirubin Total: 0.4 mg/dL (ref 0.0–1.2)
CO2: 22 mmol/L (ref 20–29)
Calcium: 9.1 mg/dL (ref 8.7–10.2)
Chloride: 103 mmol/L (ref 96–106)
Creatinine, Ser: 0.83 mg/dL (ref 0.57–1.00)
GFR calc Af Amer: 97 mL/min/{1.73_m2} (ref >59)
GFR calc non Af Amer: 84 mL/min/{1.73_m2} (ref >59)
Globulin, Total: 2.5 g/dL (ref 1.5–4.5)
Glucose: 101 mg/dL — ABNORMAL HIGH (ref 65–99)
Potassium: 4 mmol/L (ref 3.5–5.2)
Sodium: 142 mmol/L (ref 134–144)
Total Protein: 7 g/dL (ref 6.0–8.5)

## 2020-10-22 LAB — VITAMIN B12: Vitamin B-12: 438 pg/mL (ref 232–1245)

## 2020-10-22 LAB — VITAMIN D 25 HYDROXY (VIT D DEFICIENCY, FRACTURES): Vit D, 25-Hydroxy: 67.4 ng/mL (ref 30.0–100.0)

## 2020-10-23 DIAGNOSIS — R634 Abnormal weight loss: Secondary | ICD-10-CM

## 2020-10-23 DIAGNOSIS — K909 Intestinal malabsorption, unspecified: Secondary | ICD-10-CM

## 2020-11-08 ENCOUNTER — Ambulatory Visit
Admission: RE | Admit: 2020-11-08 | Discharge: 2020-11-08 | Disposition: A | Discharge status: Home / Self Care | Payer: Managed Care, Other (non HMO) | Source: Ambulatory Visit | Attending: Gastroenterology | Admitting: Gastroenterology

## 2020-11-08 ENCOUNTER — Other Ambulatory Visit: Payer: Self-pay

## 2020-11-08 ENCOUNTER — Telehealth: Payer: Self-pay

## 2020-11-08 ENCOUNTER — Encounter: Payer: Self-pay | Admitting: Gastroenterology

## 2020-11-08 ENCOUNTER — Ambulatory Visit: Payer: Managed Care, Other (non HMO) | Admitting: Gastroenterology

## 2020-11-08 VITALS — BP 113/73 | HR 70 | Temp 97.7°F | Ht 62.0 in | Wt 95.6 lb

## 2020-11-08 DIAGNOSIS — R1901 Right upper quadrant abdominal swelling, mass and lump: Secondary | ICD-10-CM

## 2020-11-08 DIAGNOSIS — R1011 Right upper quadrant pain: Secondary | ICD-10-CM | POA: Diagnosis not present

## 2020-11-08 DIAGNOSIS — R634 Abnormal weight loss: Secondary | ICD-10-CM

## 2020-11-08 DIAGNOSIS — Z1211 Encounter for screening for malignant neoplasm of colon: Secondary | ICD-10-CM

## 2020-11-08 MED ORDER — NA SULFATE-K SULFATE-MG SULF 17.5-3.13-1.6 GM/177ML PO SOLN: ORAL | 0 refills | Status: DC

## 2020-11-08 NOTE — Telephone Encounter (Signed)
Error

## 2020-11-08 NOTE — Progress Notes (Signed)
Carmen Logan [address]  [place], [place] [address]  Main: [phone]  Fax: [phone]   Gastroenterology Consultation  Referring Provider:     Nobie Putnam * Primary Care Physician:  Olin Hauser, DO Reason for Consultation:    Weight loss, abdominal pain        HPI:    Chief Complaint  Patient presents with  . Weight Loss    Carmen Logan is a 50 y.o. y/o female referred for consultation & management  by Dr. Parks Ranger, Devonne Doughty, DO.  Patient reports right upper quadrant pain, associated with distention, especially after meals, weight loss over the last 9 to 12 months.  Describes pain as dull, nonradiating, 5/10.  Also reports nausea but no vomiting, except for a self-limited episode of nausea vomiting and diarrhea last year when other family members were having similar symptoms and this has self resolved.  Reports chronic constipation as well.  No dysphagia.  No prior upper or lower endoscopy.  No family history of GI malignancy.  Patient went to the ER in October 2021 due to fatigue, syncopal episodes and was diagnosed with orthostatic hypotension/dehydration and discharged with conservative management from the ER.  Transaminases were elevated during that time, and most recent liver enzymes are normal.  Past Medical History:  Diagnosis Date  . Anxiety   . Cervical dysplasia   . Depression   . Dyspareunia, female   . Endometriosis   . Facial skin lesion   . HPV test positive   . Migraine   . Surgical menopause     Past Surgical History:  Procedure Laterality Date  . CERVICAL DISCECTOMY  03/17/2014   w/ fusion and plating  . CRYOTHERAPY     cervix  . VAGINAL HYSTERECTOMY  10/2009   Total vaginal hysterectomy, lso    Prior to Admission medications   Medication Sig Start Date End Date Taking? Authorizing Provider  busPIRone (BUSPAR) 7.5 MG tablet TAKE 1 TABLET(7.5 MG) BY MOUTH TWICE DAILY AS NEEDED 04/17/20    Karamalegos, Devonne Doughty, DO  butalbital-acetaminophen-caffeine (FIORICET WITH CODEINE) 50-325-40-30 MG capsule TAKE 1 OR 2 CAPSULES BY MOUTH EVERY 4 HOURS AS NEEDED FOR HEADACHE OR MIGRAINE, NO MORE THAN 6 CAPSULES DAILY 10/19/20   Parks Ranger, Devonne Doughty, DO  calcium-vitamin D (OSCAL WITH D) 250-125 MG-UNIT tablet Take 1 tablet by mouth daily.    [provider]  estradiol (ESTRACE) 1 MG tablet Take 1 tablet (1 mg total) by mouth daily. 05/19/20   Philip Aspen, CNM  ondansetron (ZOFRAN ODT) 4 MG disintegrating tablet Take 1 tablet (4 mg total) by mouth every 8 (eight) hours as needed for nausea or vomiting. 10/19/20   Parks Ranger, Devonne Doughty, DO  sertraline (ZOLOFT) 50 MG tablet Take 1.5 tablets (75 mg total) by mouth daily. 10/19/20   Karamalegos, Devonne Doughty, DO  traZODone (DESYREL) 50 MG tablet TAKE 1 TABLET BY MOUTH EVERY NIGHT AT BEDTIME. MAY START WITH 1/2 TABLET EVERY NIGHT AT BEDTIME IF PREFERRED 06/04/20   Karamalegos, Devonne Doughty, DO    Family History  Problem Relation Age of Onset  . Heart disease Mother   . Hypertension Mother   . Thyroid disease Mother   . Heart disease Father   . Heart attack Father 30  . Diabetes Maternal Aunt   . Diabetes Maternal Grandmother   . Depression Sister   . Cancer Neg Hx   . Breast cancer Neg Hx      Social  History   Tobacco Use  . Smoking status: Former Smoker    Types: Cigarettes  . Smokeless tobacco: Former Systems developer  . Tobacco comment: Unsure duration  Vaping Use  . Vaping Use: Never used  Substance Use Topics  . Alcohol use: Yes    Comment: occasional  . Drug use: No    Allergies as of 11/08/2020  . (No Known Allergies)    Review of Systems:    All systems reviewed and negative except where noted in HPI.   Physical Exam:  BP 113/73   Pulse 70   Temp 97.7 F (36.5 C) (Oral)   Ht 5' 2"  (1.575 m)   Wt 95 lb 9.6 oz (43.4 kg)   BMI 17.49 kg/m  No LMP recorded. Patient has had a hysterectomy. Psych:  Alert and  cooperative. Normal mood and affect. General:   Alert,  Well-developed, well-nourished, pleasant and cooperative in NAD Head:  Normocephalic and atraumatic. Eyes:  Sclera clear, no icterus.   Conjunctiva pink. Ears:  Normal auditory acuity. Nose:  No deformity, discharge, or lesions. Mouth:  No deformity or lesions,oropharynx pink & moist. Neck:  Supple; no masses or thyromegaly. Abdomen:  Normal bowel sounds.  No bruits.  Soft, tender to palpation right upper quadrant, and non-distended without masses, hepatosplenomegaly or hernias noted.  No guarding or rebound tenderness.    Msk:  Symmetrical without gross deformities. Good, equal movement & strength bilaterally. Pulses:  Normal pulses noted. Extremities:  No clubbing or edema.  No cyanosis. Neurologic:  Alert and oriented x3;  grossly normal neurologically. Skin:  Intact without significant lesions or rashes. No jaundice. Lymph Nodes:  No significant cervical adenopathy. Psych:  Alert and cooperative. Normal mood and affect.   Labs: CBC    Component Value Date/Time   WBC 7.4 10/20/2020 0950   WBC 6.1 11/08/2011 1121   RBC 4.37 10/20/2020 0950   RBC 4.48 11/08/2011 1121   HGB 13.1 10/20/2020 0950   HCT 38.6 10/20/2020 0950   PLT 214 10/20/2020 0950   MCV 88 10/20/2020 0950   MCV 93 11/08/2011 1121   MCH 30.0 10/20/2020 0950   MCH 31.2 11/08/2011 1121   MCHC 33.9 10/20/2020 0950   MCHC 33.6 11/08/2011 1121   RDW 13.0 10/20/2020 0950   RDW 12.8 11/08/2011 1121   LYMPHSABS 1.5 10/20/2020 0950   EOSABS 0.0 10/20/2020 0950   BASOSABS 0.0 10/20/2020 0950   CMP     Component Value Date/Time   NA 142 10/20/2020 0950   NA 139 11/08/2011 1121   K 4.0 10/20/2020 0950   K 3.9 11/08/2011 1121   CL 103 10/20/2020 0950   CL 107 11/08/2011 1121   CO2 22 10/20/2020 0950   CO2 27 11/08/2011 1121   GLUCOSE 101 (H) 10/20/2020 0950   GLUCOSE 79 11/08/2011 1121   BUN 8 10/20/2020 0950   BUN 8 11/08/2011 1121   CREATININE 0.83  10/20/2020 0950   CREATININE 0.78 11/08/2011 1121   CALCIUM 9.1 10/20/2020 0950   CALCIUM 8.6 11/08/2011 1121   PROT 7.0 10/20/2020 0950   ALBUMIN 4.5 10/20/2020 0950   AST 15 10/20/2020 0950   ALT 10 10/20/2020 0950   ALKPHOS 66 10/20/2020 0950   BILITOT 0.4 10/20/2020 0950   GFRNONAA 84 10/20/2020 0950   GFRNONAA >60 11/08/2011 1121   GFRAA 97 10/20/2020 0950   GFRAA >60 11/08/2011 1121    Imaging Studies: No results found.  Assessment and Plan:   Carmen Logan is  a 51 y.o. y/o female has been referred for weight loss, abdominal pain  Would recommend right upper quadrant ultrasound given right upper quadrant pain to evaluate for any underlying gallbladder etiology.  Most recent liver enzymes are normal, therefore suspicion for biliary obstruction is low.  However, if symptoms worsen or change patient advised to call us back and she verbalized understanding  Once ultrasound is available and if is unrevealing, proceed with EGD and colonoscopy.  Plan on 2-day prep due to underlying chronic constipation as well  Patient is due for colonoscopy for screening and also would benefit from EGD given significant unintentional weight loss over the last year despite good appetite, and eating well    Dr Carmen Logan  Speech recognition software was used to dictate the above note.

## 2020-11-08 NOTE — Patient Instructions (Signed)
Please arrive 30 minutes before your ultrasound. Please do not eat or drink anything after midnight the night before.

## 2020-11-09 ENCOUNTER — Other Ambulatory Visit: Payer: Self-pay

## 2020-11-09 ENCOUNTER — Encounter: Payer: Self-pay | Admitting: Gastroenterology

## 2020-11-12 ENCOUNTER — Other Ambulatory Visit
Admission: RE | Admit: 2020-11-12 | Discharge: 2020-11-12 | Disposition: A | Discharge status: Home / Self Care | Payer: Managed Care, Other (non HMO) | Source: Ambulatory Visit | Attending: Gastroenterology | Admitting: Gastroenterology

## 2020-11-12 ENCOUNTER — Other Ambulatory Visit: Payer: Self-pay

## 2020-11-12 DIAGNOSIS — Z01812 Encounter for preprocedural laboratory examination: Secondary | ICD-10-CM | POA: Insufficient documentation

## 2020-11-12 DIAGNOSIS — Z20822 Contact with and (suspected) exposure to covid-19: Secondary | ICD-10-CM | POA: Insufficient documentation

## 2020-11-12 LAB — SARS CORONAVIRUS 2 (TAT 6-24 HRS): SARS Coronavirus 2: NEGATIVE

## 2020-11-15 NOTE — Discharge Instructions (Signed)

## 2020-11-16 ENCOUNTER — Ambulatory Visit: Payer: Managed Care, Other (non HMO) | Admitting: Anesthesiology

## 2020-11-16 ENCOUNTER — Telehealth: Payer: Self-pay

## 2020-11-16 ENCOUNTER — Encounter
Admission: RE | Disposition: A | Discharge status: Home / Self Care | Payer: Self-pay | Source: Home / Self Care | Attending: Gastroenterology

## 2020-11-16 ENCOUNTER — Ambulatory Visit: Admit: 2020-11-16 | Payer: Managed Care, Other (non HMO) | Admitting: Gastroenterology

## 2020-11-16 ENCOUNTER — Encounter: Payer: Self-pay | Admitting: Gastroenterology

## 2020-11-16 ENCOUNTER — Ambulatory Visit
Admission: RE | Admit: 2020-11-16 | Discharge: 2020-11-16 | Disposition: A | Discharge status: Home / Self Care | Payer: Managed Care, Other (non HMO) | Attending: Gastroenterology | Admitting: Gastroenterology

## 2020-11-16 ENCOUNTER — Other Ambulatory Visit: Payer: Self-pay

## 2020-11-16 DIAGNOSIS — R109 Unspecified abdominal pain: Secondary | ICD-10-CM | POA: Diagnosis not present

## 2020-11-16 DIAGNOSIS — Z833 Family history of diabetes mellitus: Secondary | ICD-10-CM | POA: Diagnosis not present

## 2020-11-16 DIAGNOSIS — K2940 Chronic atrophic gastritis without bleeding: Secondary | ICD-10-CM | POA: Diagnosis not present

## 2020-11-16 DIAGNOSIS — R634 Abnormal weight loss: Secondary | ICD-10-CM | POA: Insufficient documentation

## 2020-11-16 DIAGNOSIS — Z8249 Family history of ischemic heart disease and other diseases of the circulatory system: Secondary | ICD-10-CM | POA: Diagnosis not present

## 2020-11-16 DIAGNOSIS — Z87891 Personal history of nicotine dependence: Secondary | ICD-10-CM | POA: Diagnosis not present

## 2020-11-16 DIAGNOSIS — Z79899 Other long term (current) drug therapy: Secondary | ICD-10-CM | POA: Diagnosis not present

## 2020-11-16 DIAGNOSIS — Z8349 Family history of other endocrine, nutritional and metabolic diseases: Secondary | ICD-10-CM | POA: Diagnosis not present

## 2020-11-16 DIAGNOSIS — K6289 Other specified diseases of anus and rectum: Secondary | ICD-10-CM | POA: Diagnosis not present

## 2020-11-16 DIAGNOSIS — K449 Diaphragmatic hernia without obstruction or gangrene: Secondary | ICD-10-CM | POA: Insufficient documentation

## 2020-11-16 DIAGNOSIS — Z1211 Encounter for screening for malignant neoplasm of colon: Secondary | ICD-10-CM | POA: Diagnosis present

## 2020-11-16 DIAGNOSIS — Z681 Body mass index (BMI) 19 or less, adult: Secondary | ICD-10-CM | POA: Insufficient documentation

## 2020-11-16 DIAGNOSIS — Z818 Family history of other mental and behavioral disorders: Secondary | ICD-10-CM | POA: Insufficient documentation

## 2020-11-16 DIAGNOSIS — R1011 Right upper quadrant pain: Secondary | ICD-10-CM | POA: Diagnosis not present

## 2020-11-16 HISTORY — PX: ESOPHAGOGASTRODUODENOSCOPY: SHX5428

## 2020-11-16 HISTORY — DX: Presence of spectacles and contact lenses: Z97.3

## 2020-11-16 HISTORY — PX: BIOPSY: SHX5522

## 2020-11-16 HISTORY — PX: COLONOSCOPY WITH PROPOFOL: SHX5780

## 2020-11-16 SURGERY — COLONOSCOPY WITH PROPOFOL
Anesthesia: General | Site: Rectum

## 2020-11-16 SURGERY — ESOPHAGOGASTRODUODENOSCOPY (EGD) WITH PROPOFOL
Anesthesia: Choice

## 2020-11-16 MED ORDER — LACTATED RINGERS IV SOLN: INTRAVENOUS | Status: DC

## 2020-11-16 MED ORDER — STERILE WATER FOR IRRIGATION IR SOLN
Status: DC | PRN
  Administered 2020-11-16: 100 mL

## 2020-11-16 MED ORDER — LACTATED RINGERS IV SOLN: INTRAVENOUS | Status: DC | PRN

## 2020-11-16 MED ORDER — LIDOCAINE HCL (CARDIAC) PF 100 MG/5ML IV SOSY
PREFILLED_SYRINGE | INTRAVENOUS | Status: DC | PRN
  Administered 2020-11-16: 50 mg via INTRAVENOUS

## 2020-11-16 MED ORDER — SODIUM CHLORIDE 0.9 % IV SOLN: INTRAVENOUS | Status: DC

## 2020-11-16 MED ORDER — PROPOFOL 10 MG/ML IV BOLUS
INTRAVENOUS | Status: DC | PRN
  Administered 2020-11-16 (×2): 20 mg via INTRAVENOUS
  Administered 2020-11-16: 70 mg via INTRAVENOUS
  Administered 2020-11-16 (×9): 20 mg via INTRAVENOUS

## 2020-11-16 MED ORDER — GLYCOPYRROLATE 0.2 MG/ML IJ SOLN
INTRAMUSCULAR | Status: DC | PRN
  Administered 2020-11-16: .1 mg via INTRAVENOUS

## 2020-11-16 SURGICAL SUPPLY — 25 items
CLIP HMST 235XBRD CATH ROT (MISCELLANEOUS) IMPLANT
CLIP RESOLUTION 360 11X235 (MISCELLANEOUS)
ELECT REM PT RETURN 9FT ADLT (ELECTROSURGICAL)
ELECTRODE REM PT RTRN 9FT ADLT (ELECTROSURGICAL) IMPLANT
FCP ESCP3.2XJMB 240X2.8X (MISCELLANEOUS)
FORCEPS BIOP RAD 4 LRG CAP 4 (CUTTING FORCEPS) ×3 IMPLANT
FORCEPS BIOP RJ4 240 W/NDL (MISCELLANEOUS)
FORCEPS ESCP3.2XJMB 240X2.8X (MISCELLANEOUS) IMPLANT
GOWN CVR UNV OPN BCK APRN NK (MISCELLANEOUS) ×4 IMPLANT
GOWN ISOL THUMB LOOP REG UNIV (MISCELLANEOUS) ×6
INJECTOR VARIJECT VIN23 (MISCELLANEOUS) IMPLANT
KIT DEFENDO VALVE AND CONN (KITS) IMPLANT
KIT PRC NS LF DISP ENDO (KITS) ×2 IMPLANT
KIT PROCEDURE OLYMPUS (KITS) ×3
MANIFOLD NEPTUNE II (INSTRUMENTS) ×3 IMPLANT
MARKER SPOT ENDO TATTOO 5ML (MISCELLANEOUS) IMPLANT
PROBE APC STR FIRE (PROBE) IMPLANT
RETRIEVER NET ROTH 2.5X230 LF (MISCELLANEOUS) IMPLANT
SNARE SHORT THROW 13M SML OVAL (MISCELLANEOUS) IMPLANT
SNARE SHORT THROW 30M LRG OVAL (MISCELLANEOUS) IMPLANT
SNARE SNG USE RND 15MM (INSTRUMENTS) IMPLANT
SPOT EX ENDOSCOPIC TATTOO (MISCELLANEOUS)
TRAP ETRAP POLY (MISCELLANEOUS) IMPLANT
VARIJECT INJECTOR VIN23 (MISCELLANEOUS)
WATER STERILE IRR 250ML POUR (IV SOLUTION) ×3 IMPLANT

## 2020-11-16 NOTE — Transfer of Care (Signed)
Immediate Anesthesia Transfer of Care Note  Patient: Carmen Logan  Procedure(s) Performed: COLONOSCOPY WITH PROPOFOL (N/A Rectum) ESOPHAGOGASTRODUODENOSCOPY (EGD) (N/A Esophagus) BIOPSY (N/A Esophagus)  Patient Location: PACU  Anesthesia Type: General  Level of Consciousness: awake, alert  and patient cooperative  Airway and Oxygen Therapy: Patient Spontanous Breathing and Patient connected to supplemental oxygen  Post-op Assessment: Post-op Vital signs reviewed, Patient's Cardiovascular Status Stable, Respiratory Function Stable, Patent Airway and No signs of Nausea or vomiting  Post-op Vital Signs: Reviewed and stable  Complications: No complications documented.

## 2020-11-16 NOTE — Anesthesia Postprocedure Evaluation (Signed)
Anesthesia Post Note  Patient: Carmen Logan  Procedure(s) Performed: COLONOSCOPY WITH PROPOFOL (N/A Rectum) ESOPHAGOGASTRODUODENOSCOPY (EGD) (N/A Esophagus) BIOPSY (N/A Esophagus)     Patient location during evaluation: PACU Anesthesia Type: General Level of consciousness: awake and alert Pain management: pain level controlled Vital Signs Assessment: post-procedure vital signs reviewed and stable Respiratory status: spontaneous breathing, nonlabored ventilation and respiratory function stable Cardiovascular status: blood pressure returned to baseline and stable Postop Assessment: no apparent nausea or vomiting Anesthetic complications: no   No complications documented.  Wanda Plump Pammie Chirino

## 2020-11-16 NOTE — Op Note (Signed)
[facility] Patient Name: Carmen Logan Procedure Date: 11/16/2020 10:54 AM MRN: [id number] Account #: [id number] Date of Birth: 01/04/1971 Admit Type: Outpatient Age: 50 Room: [facility] OR ROOM 01 Gender: Female Note Status: Finalized Procedure:             Colonoscopy Indications:           Screening for colorectal malignant neoplasm Providers:             Tayton Decaire B. Bonna Gains MD, MD Referring MD:          Olin Hauser (Referring MD) Medicines:             Monitored Anesthesia Care Complications:         No immediate complications. Procedure:             Pre-Anesthesia Assessment:                        - Prior to the procedure, a History and Physical was                         performed, and patient medications, allergies and                         sensitivities were reviewed. The patient's tolerance                         of previous anesthesia was reviewed.                        - The risks and benefits of the procedure and the                         sedation options and risks were discussed with the                         patient. All questions were answered and informed                         consent was obtained.                        - Patient identification and proposed procedure were                         verified prior to the procedure by the physician, the                         nurse, the anesthetist and the technician. The                         procedure was verified in the pre-procedure area in                         the procedure room in the endoscopy suite.                        - ASA Grade Assessment: II - A patient with mild  systemic disease.                        - After reviewing the risks and benefits, the patient                         was deemed in satisfactory condition to undergo the                         procedure.                        After obtaining informed  consent, the colonoscope was                         passed under direct vision. Throughout the procedure,                         the patient's blood pressure, pulse, and oxygen                         saturations were monitored continuously. The was                         introduced through the anus and advanced to the the                         terminal ileum. The colonoscopy was performed with                         ease. The patient tolerated the procedure well. The                         quality of the bowel preparation was good. Findings:      The perianal and digital rectal examinations were normal.      The rectum, sigmoid colon, descending colon, transverse colon, ascending       colon, cecum and ileum appeared normal.      Anal papilla(e) were hypertrophied.      No additional abnormalities were found on retroflexion. Impression:            - The rectum, sigmoid colon, descending colon,                         transverse colon, ascending colon, cecum and terminal                         ileum are normal.                        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                        - No specimens collected. Recommendation:        - Refer to [place] [facility] for evaluation of larger                         than usual hypertrophied anal papillae, and discuss  any need or biopsy or removal to rule any other                         underlying pathology.                        - Discharge patient to home.                        - Resume previous diet.                        - Continue present medications.                        - Repeat colonoscopy in 10 years for screening                         purposes.                        - Return to primary care physician as previously                         scheduled.                        - The findings and recommendations were discussed with                         the patient.                         - The findings and recommendations were discussed with                         the patient's family.                        - In the future, if patient develops new symptoms such                         as blood per rectum, abdominal pain, weight loss,                         altered bowel habits or any other reason for concern,                         patient should discuss this with thier PCP as they may                         need a GI referral at that time or evaluation for need                         for colonoscopy earlier than the recommended screening                         colonoscopy.                        In addition, if patient's family history of colon  cancer changes (no family history at this time) in the                         future, earlier screening may be indicated and patient                         should discuss this with PCP as well. Procedure Code(s):     --- Professional ---                        [phone], Colonoscopy, flexible; diagnostic, including                         collection of specimen(s) by brushing or washing, when                         performed (separate procedure) Diagnosis Code(s):     --- Professional ---                        Z12.11, Encounter for screening for malignant neoplasm                         of colon CPT copyright 2019 [employer]. All rights reserved. The codes documented in this report are preliminary and upon coder review may  be revised to meet current compliance requirements.  Vonda Antigua, MD Margretta Sidle B. Bonna Gains MD, MD 11/16/2020 12:21:51 PM This report has been signed electronically. Number of Addenda: 0 Note Initiated On: 11/16/2020 10:54 AM Scope Withdrawal Time: 0 hours 14 minutes 49 seconds  Total Procedure Duration: 0 hours 17 minutes 31 seconds       [facility]

## 2020-11-16 NOTE — H&P (Addendum)
Carmen Antigua, MD [address] [place], [place], [place], [address] [address], [place], [place], [place], [address] Phone: [phone]  Fax: [phone]  Primary Care Physician:  Olin Hauser, DO   Pre-Procedure History & Physical: HPI:  Carmen Logan is a 50 y.o. female is here for a colonoscopy and upper endoscopy.   Past Medical History:  Diagnosis Date  . Anxiety   . Cervical dysplasia   . Depression   . Dyspareunia, female   . Endometriosis   . Facial skin lesion   . HPV test positive   . Migraine    "seems weather related"  . Surgical menopause   . Wears contact lenses    sometimes    Past Surgical History:  Procedure Laterality Date  . CERVICAL DISCECTOMY  03/17/2014   w/ fusion and plating  . CRYOTHERAPY     cervix  . VAGINAL HYSTERECTOMY  10/2009   Total vaginal hysterectomy, lso    Prior to Admission medications   Medication Sig Start Date End Date Taking? Authorizing Provider  busPIRone (BUSPAR) 7.5 MG tablet TAKE 1 TABLET(7.5 MG) BY MOUTH TWICE DAILY AS NEEDED 04/17/20  Yes Karamalegos, Devonne Doughty, DO  butalbital-acetaminophen-caffeine (FIORICET WITH CODEINE) 50-325-40-30 MG capsule TAKE 1 OR 2 CAPSULES BY MOUTH EVERY 4 HOURS AS NEEDED FOR HEADACHE OR MIGRAINE, NO MORE THAN 6 CAPSULES DAILY 10/19/20  Yes Karamalegos, Devonne Doughty, DO  calcium-vitamin D (OSCAL WITH D) 250-125 MG-UNIT tablet Take 1 tablet by mouth daily.   Yes [provider]  estradiol (ESTRACE) 1 MG tablet Take 1 tablet (1 mg total) by mouth daily. 05/19/20  Yes Philip Aspen, CNM  ondansetron (ZOFRAN ODT) 4 MG disintegrating tablet Take 1 tablet (4 mg total) by mouth every 8 (eight) hours as needed for nausea or vomiting. 10/19/20  Yes Karamalegos, Devonne Doughty, DO  sertraline (ZOLOFT) 50 MG tablet Take 1.5 tablets (75 mg total) by mouth daily. 10/19/20  Yes Karamalegos, Devonne Doughty, DO  traZODone (DESYREL) 50 MG tablet TAKE 1 TABLET BY MOUTH EVERY NIGHT AT  BEDTIME. MAY START WITH 1/2 TABLET EVERY NIGHT AT BEDTIME IF PREFERRED 06/04/20  Yes Karamalegos, Alexander J, DO  Na Sulfate-K Sulfate-Mg Sulf 17.5-3.13-1.6 GM/177ML SOLN At 5 PM the day before procedure take 1 bottle and 5 hours before procedure take 1 bottle. 11/08/20   Virgel Manifold, MD    Allergies as of 11/08/2020  . (No Known Allergies)    Family History  Problem Relation Age of Onset  . Heart disease Mother   . Hypertension Mother   . Thyroid disease Mother   . Heart disease Father   . Heart attack Father 66  . Diabetes Maternal Aunt   . Diabetes Maternal Grandmother   . Depression Sister   . Cancer Neg Hx   . Breast cancer Neg Hx     Social History   Socioeconomic History  . Marital status: Married    Spouse name: Not on file  . Number of children: 2  . Years of education: College  . Highest education level: Bachelor's degree (e.g., BA, AB, BS)  Occupational History  . 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 Smoking status: Former Smoker    Types: Cigarettes    Quit date: 2015    Years since quitting: 7.2  . Smokeless tobacco: Never Used  . Tobacco comment: Unsure duration  Vaping Use  . Vaping Use: Former  . Quit date: 02/25/2014  Substance and Sexual Activity  . Alcohol use: Yes    Comment: occasional  . Drug use: No  . Sexual activity: Yes    Birth control/protection: Surgical  Other Topics Concern  . Not on file  Social History Narrative  . Not on file   Social Determinants of Health   Financial Resource Strain: Not on file  Food Insecurity: Not on file  Transportation Needs: Not on file  Physical Activity: Not on file  Stress: Not on file  Social Connections: Not on file  Intimate Partner Violence: Not on file    Review of Systems: See HPI, otherwise negative ROS  Physical Exam: Ht 5' 2"  (1.575 m)   Wt 43.1 kg   BMI 17.38 kg/m  General:   Alert,  pleasant and cooperative in NAD Head:  Normocephalic and  atraumatic. Neck:  Supple; no masses or thyromegaly. Lungs:  Clear throughout to auscultation, normal respiratory effort.    Heart:  +S1, +S2, Regular rate and rhythm, No edema. Abdomen:  Soft, nontender and nondistended. Normal bowel sounds, without guarding, and without rebound.   Neurologic:  Alert and  oriented x4;  grossly normal neurologically.  Impression/Plan: Carmen Logan is here for a colonoscopy to be performed for average risk screening and EGD for abdominal pain.  Risks, benefits, limitations, and alternatives regarding  colonoscopy have been reviewed with the patient.  Questions have been answered.  All parties agreeable.   Virgel Manifold, MD  11/16/2020, 10:04 AM

## 2020-11-16 NOTE — Op Note (Signed)
[facility]astroenterology Patient Name: Carmen Logan Procedure Date: 11/16/2020 11:29 AM MRN: [id number] Account #: [id number] Date of Birth: 1970-12-06 Admit Type: Outpatient Age: 50 Room: [facility] OR ROOM 1 Gender: Female Note Status: Finalized Procedure:             Upper GI endoscopy Indications:           Abdominal pain, Weight loss Providers:             Loomis Anacker B. Bonna Gains MD, MD Referring MD:          Olin Hauser (Referring MD) Medicines:             Monitored Anesthesia Care Complications:         No immediate complications. Procedure:             Pre-Anesthesia Assessment:                        - Prior to the procedure, a History and Physical was                         performed, and patient medications, allergies and                         sensitivities were reviewed. The patient's tolerance                         of previous anesthesia was reviewed.                        - The risks and benefits of the procedure and the                         sedation options and risks were discussed with the                         patient. All questions were answered and informed                         consent was obtained.                        - Patient identification and proposed procedure were                         verified prior to the procedure by the physician, the                         nurse, the anesthesiologist, the anesthetist and the                         technician. The procedure was verified in the                         procedure room.                        - ASA Grade Assessment: II - A patient with mild                         systemic  disease.                        After obtaining informed consent, the endoscope was                         passed under direct vision. Throughout the procedure,                         the patient's blood pressure, pulse, and oxygen                         saturations were monitored  continuously. The was                         introduced through the mouth, and advanced to the                         second part of duodenum. The upper GI endoscopy was                         accomplished with ease. The patient tolerated the                         procedure well. Findings:      The examined esophagus was normal.      Patchy mildly erythematous mucosa without bleeding was found in the       gastric antrum. Biopsies were taken with a cold forceps for histology.       Biopsies were obtained in the gastric body, at the incisura and in the       gastric antrum with cold forceps for histology.      Atrophic mucosa was found in the gastric body.      A small hiatal hernia was present.      The exam of the stomach was otherwise normal.      The duodenal bulb, second portion of the duodenum and examined duodenum       were normal. Impression:            - Normal esophagus.                        - Erythematous mucosa in the antrum. Biopsied.                        - Gastric mucosal atrophy.                        - Small hiatal hernia.                        - Normal duodenal bulb, second portion of the duodenum                         and examined duodenum.                        - Biopsies were obtained in the gastric body, at the                         incisura and in  the gastric antrum. Recommendation:        - Await pathology results.                        - Discharge patient to home (with escort).                        - Advance diet as tolerated.                        - Continue present medications.                        - Patient has a contact number available for                         emergencies. The signs and symptoms of potential                         delayed complications were discussed with the patient.                         Return to normal activities tomorrow. Written                         discharge instructions were provided to the patient.                         - Discharge patient to home (with escort).                        - The findings and recommendations were discussed with                         the patient.                        - The findings and recommendations were discussed with                         the patient's family. Procedure Code(s):     --- Professional ---                        [phone], Esophagogastroduodenoscopy, flexible,                         transoral; with biopsy, single or multiple Diagnosis Code(s):     --- Professional ---                        K31.89, Other diseases of stomach and duodenum                        K44.9, Diaphragmatic hernia without obstruction or                         gangrene                        R63.4, Abnormal weight loss  R10.9, Unspecified abdominal pain CPT copyright 2019 American Medical Association. All rights reserved. The codes documented in this report are preliminary and upon coder review may  be revised to meet current compliance requirements.  Vonda Antigua, MD Margretta Sidle B. Bonna Gains MD, MD 11/16/2020 11:57:09 AM This report has been signed electronically. Number of Addenda: 0 Note Initiated On: 11/16/2020 11:29 AM Total Procedure Duration: 0 hours 5 minutes 29 seconds  Estimated Blood Loss:  Estimated blood loss: none.      [facility]

## 2020-11-16 NOTE — Telephone Encounter (Signed)
-----   Message from Virgel Manifold, MD sent at 11/16/2020 12:51 PM EDT ----- Please refer to [place] [facility] for "Larger than usual hypertrophied anal papillae. Evaluate for monitoring vs biopsy vs removal".

## 2020-11-16 NOTE — Telephone Encounter (Signed)
Referral sent to [facility] for Larger than usual hypertrophied anal papillae. Evaluate for monitoring vs biopsy vs removal". They will contact the patient with date and time.

## 2020-11-16 NOTE — Anesthesia Preprocedure Evaluation (Signed)
Anesthesia Evaluation  Patient identified by MRN, date of birth, ID band Patient awake    Reviewed: Allergy & Precautions, NPO status , Patient's Chart, lab work & pertinent test results  History of Anesthesia Complications Negative for: history of anesthetic complications  Airway Mallampati: II  TM Distance: >3 FB Neck ROM: Full    Dental   Pulmonary former smoker,    Pulmonary exam normal        Cardiovascular negative cardio ROS Normal cardiovascular exam     Neuro/Psych  Headaches, PSYCHIATRIC DISORDERS Anxiety Depression    GI/Hepatic negative GI ROS,   Endo/Other    Renal/GU      Musculoskeletal   Abdominal   Peds  Hematology   Anesthesia Other Findings   Reproductive/Obstetrics                             Anesthesia Physical Anesthesia Plan  ASA: II  Anesthesia Plan: General   Post-op Pain Management:    Induction: Intravenous  PONV Risk Score and Plan:   Airway Management Planned: Simple Face Mask  Additional Equipment:   Intra-op Plan:   Post-operative Plan:   Informed Consent: I have reviewed the patients History and Physical, chart, labs and discussed the procedure including the risks, benefits and alternatives for the proposed anesthesia with the patient or authorized representative who has indicated his/her understanding and acceptance.       Plan Discussed with: CRNA  Anesthesia Plan Comments:         Anesthesia Quick Evaluation

## 2020-11-16 NOTE — Addendum Note (Signed)
Addended by: Wayna Chalet on: 11/16/2020 10:28 AM   Modules accepted: Orders, SmartSet

## 2020-11-16 NOTE — Anesthesia Procedure Notes (Signed)
Performed by: Stevan Eberwein, CRNA Pre-anesthesia Checklist: Patient identified, Emergency Drugs available, Suction available, Timeout performed and Patient being monitored Patient Re-evaluated:Patient Re-evaluated prior to induction Oxygen Delivery Method: Nasal cannula Placement Confirmation: positive ETCO2       

## 2020-11-17 ENCOUNTER — Encounter: Payer: Self-pay | Admitting: Gastroenterology

## 2020-11-18 LAB — SURGICAL PATHOLOGY

## 2020-11-22 ENCOUNTER — Encounter: Payer: Self-pay | Admitting: Gastroenterology

## 2020-11-22 ENCOUNTER — Other Ambulatory Visit: Payer: Self-pay | Admitting: Gastroenterology

## 2020-11-22 ENCOUNTER — Other Ambulatory Visit: Payer: Self-pay

## 2020-11-22 ENCOUNTER — Ambulatory Visit (INDEPENDENT_AMBULATORY_CARE_PROVIDER_SITE_OTHER): Payer: Managed Care, Other (non HMO) | Admitting: Gastroenterology

## 2020-11-22 VITALS — BP 110/76 | HR 74 | Temp 97.6°F | Wt 90.0 lb

## 2020-11-22 DIAGNOSIS — K219 Gastro-esophageal reflux disease without esophagitis: Secondary | ICD-10-CM | POA: Diagnosis not present

## 2020-11-22 MED ORDER — OMEPRAZOLE 40 MG PO CPDR: 40.0000 mg | DELAYED_RELEASE_CAPSULE | Freq: Every day | ORAL | 0 refills | Status: DC

## 2020-11-22 NOTE — Patient Instructions (Signed)
Please buy a pillow wedge to help you with your acid reflux and hiatal hernia symptoms.       Gastroesophageal Reflux Disease, Adult  Gastroesophageal reflux (GER) happens when acid from the stomach flows up into the tube that connects the mouth and the stomach (esophagus). Normally, food travels down the esophagus and stays in the stomach to be digested. With GER, food and stomach acid sometimes move back up into the esophagus. You may have a disease called gastroesophageal reflux disease (GERD) if the reflux:  Happens often.  Causes frequent or very bad symptoms.  Causes problems such as damage to the esophagus. When this happens, the esophagus becomes sore and swollen. Over time, GERD can make small holes (ulcers) in the lining of the esophagus. What are the causes? This condition is caused by a problem with the muscle between the esophagus and the stomach. When this muscle is weak or not normal, it does not close properly to keep food and acid from coming back up from the stomach. The muscle can be weak because of:  Tobacco use.  Pregnancy.  Having a certain type of hernia (hiatal hernia).  Alcohol use.  Certain foods and drinks, such as coffee, chocolate, onions, and peppermint. What increases the risk?  Being overweight.  Having a disease that affects your connective tissue.  Taking NSAIDs, such a ibuprofen. What are the signs or symptoms?  Heartburn.  Difficult or painful swallowing.  The feeling of having a lump in the throat.  A bitter taste in the mouth.  Bad breath.  Having a lot of saliva.  Having an upset or bloated stomach.  Burping.  Chest pain. Different conditions can cause chest pain. Make sure you see your doctor if you have chest pain.  Shortness of breath or wheezing.  A long-term cough or a cough at night.  Wearing away of the surface of teeth (tooth enamel).  Weight loss. How is this treated?  Making changes to your  diet.  Taking medicine.  Having surgery. Treatment will depend on how bad your symptoms are. Follow these instructions at home: Eating and drinking  Follow a diet as told by your doctor. You may need to avoid foods and drinks such as: ? Coffee and tea, with or without caffeine. ? Drinks that contain alcohol. ? Energy drinks and sports drinks. ? Bubbly (carbonated) drinks or sodas. ? Chocolate and cocoa. ? Peppermint and mint flavorings. ? Garlic and onions. ? Horseradish. ? Spicy and acidic foods. These include peppers, chili powder, curry powder, vinegar, hot sauces, and BBQ sauce. ? Citrus fruit juices and citrus fruits, such as oranges, lemons, and limes. ? Tomato-based foods. These include red sauce, chili, salsa, and pizza with red sauce. ? Fried and fatty foods. These include donuts, french fries, potato chips, and high-fat dressings. ? High-fat meats. These include hot dogs, rib eye steak, sausage, ham, and bacon. ? High-fat dairy items, such as whole milk, butter, and cream cheese.  Eat small meals often. Avoid eating large meals.  Avoid drinking large amounts of liquid with your meals.  Avoid eating meals during the 2-3 hours before bedtime.  Avoid lying down right after you eat.  Do not exercise right after you eat.   Lifestyle  Do not smoke or use any products that contain nicotine or tobacco. If you need help quitting, ask your doctor.  Try to lower your stress. If you need help doing this, ask your doctor.  If you are overweight, lose an  amount of weight that is healthy for you. Ask your doctor about a safe weight loss goal.   General instructions  Pay attention to any changes in your symptoms.  Take over-the-counter and prescription medicines only as told by your doctor.  Do not take aspirin, ibuprofen, or other NSAIDs unless your doctor says it is okay.  Wear loose clothes. Do not wear anything tight around your waist.  Raise (elevate) the head of  your bed about 6 inches (15 cm). You may need to use a wedge to do this.  Avoid bending over if this makes your symptoms worse.  Keep all follow-up visits. Contact a doctor if:  You have new symptoms.  You lose weight and you do not know why.  You have trouble swallowing or it hurts to swallow.  You have wheezing or a cough that keeps happening.  You have a hoarse voice.  Your symptoms do not get better with treatment. Get help right away if:  You have sudden pain in your arms, neck, jaw, teeth, or back.  You suddenly feel sweaty, dizzy, or light-headed.  You have chest pain or shortness of breath.  You vomit and the vomit is green, yellow, or black, or it looks like blood or coffee grounds.  You faint.  Your poop (stool) is red, bloody, or black.  You cannot swallow, drink, or eat. These symptoms may represent a serious problem that is an emergency. Do not wait to see if the symptoms will go away. Get medical help right away. Call your local emergency services (911 in the [place] Do not drive yourself to the hospital. Summary  If a person has gastroesophageal reflux disease (GERD), food and stomach acid move back up into the esophagus and cause symptoms or problems such as damage to the esophagus.  Treatment will depend on how bad your symptoms are.  Follow a diet as told by your doctor.  Take all medicines only as told by your doctor. This information is not intended to replace advice given to you by your health care provider. Make sure you discuss any questions you have with your health care provider. Document Revised: 02/23/2020 Document Reviewed: 02/23/2020 Elsevier Patient Education  [place].

## 2020-11-22 NOTE — Progress Notes (Signed)
Vonda Antigua, MD [address]  [place]  [place], [place] [address]  Main: [phone]  Fax: [phone]   Primary Care Physician: Olin Hauser, DO   Chief complaint: Abdominal pain  HPI: Carmen Logan is a 50 y.o. female here for follow-up of abdominal pain.  Patient reports burning sensation in chest and epigastric pain.  Nonradiating, 5 or 10.  Not associate with any nausea or vomiting.  Takes Tums as needed that helps.  Not on any PPI.  Has undergone EGD and colonoscopy.  EGD showed gastric erythema, gastric mucosal atrophy and small hiatal hernia.  Colonoscopy showed hypertrophied anal papillae.  Pathology showed mild chronic and focal mild reactive gastritis, negative for H. pylori or intestinal metaplasia.  Current Outpatient Medications  Medication Sig Dispense Refill  . busPIRone (BUSPAR) 7.5 MG tablet TAKE 1 TABLET(7.5 MG) BY MOUTH TWICE DAILY AS NEEDED 180 tablet 1  . butalbital-acetaminophen-caffeine (FIORICET WITH CODEINE) 50-325-40-30 MG capsule TAKE 1 OR 2 CAPSULES BY MOUTH EVERY 4 HOURS AS NEEDED FOR HEADACHE OR MIGRAINE, NO MORE THAN 6 CAPSULES DAILY 30 capsule 2  . estradiol (ESTRACE) 1 MG tablet Take 1 tablet (1 mg total) by mouth daily. 90 tablet 3  . omeprazole (PRILOSEC) 40 MG capsule Take 1 capsule (40 mg total) by mouth daily. 60 capsule 0  . ondansetron (ZOFRAN ODT) 4 MG disintegrating tablet Take 1 tablet (4 mg total) by mouth every 8 (eight) hours as needed for nausea or vomiting. 30 tablet 0  . sertraline (ZOLOFT) 50 MG tablet Take 1.5 tablets (75 mg total) by mouth daily. 135 tablet 3  . traZODone (DESYREL) 50 MG tablet TAKE 1 TABLET BY MOUTH EVERY NIGHT AT BEDTIME. MAY START WITH 1/2 TABLET EVERY NIGHT AT BEDTIME IF PREFERRED 90 tablet 1   No current facility-administered medications for this visit.    Allergies as of 11/22/2020  . (No Known Allergies)    ROS:  General: Negative for anorexia, weight loss, fever, chills,  fatigue, weakness. ENT: Negative for hoarseness, difficulty swallowing , nasal congestion. CV: Negative for chest pain, angina, palpitations, dyspnea on exertion, peripheral edema.  Respiratory: Negative for dyspnea at rest, dyspnea on exertion, cough, sputum, wheezing.  GI: See history of present illness. GU:  Negative for dysuria, hematuria, urinary incontinence, urinary frequency, nocturnal urination.  Endo: Negative for unusual weight change.    Physical Examination:  Vitals:   11/22/20 1328  BP: 110/76  Pulse: 74  Temp: 97.6 F (36.4 C)  TempSrc: Oral  Weight: 90 lb (40.8 kg)     General: Well-nourished, well-developed in no acute distress.  Eyes: No icterus. Conjunctivae pink. Mouth: Oropharyngeal mucosa moist and pink , no lesions erythema or exudate. Neck: Supple, Trachea midline Abdomen: Bowel sounds are normal, nontender, nondistended, no hepatosplenomegaly or masses, no abdominal bruits or hernia , no rebound or guarding.   Extremities: No lower extremity edema. No clubbing or deformities. Neuro: Alert and oriented x 3.  Grossly intact. Skin: Warm and dry, no jaundice.   Psych: Alert and cooperative, normal mood and affect.   Labs: CMP     Component Value Date/Time   NA 142 10/20/2020 0950   NA 139 11/08/2011 1121   K 4.0 10/20/2020 0950   K 3.9 11/08/2011 1121   CL 103 10/20/2020 0950   CL 107 11/08/2011 1121   CO2 22 10/20/2020 0950   CO2 27 11/08/2011 1121   GLUCOSE 101 (H) 10/20/2020 0950   GLUCOSE 79 11/08/2011 1121  BUN 8 10/20/2020 0950   BUN 8 11/08/2011 1121   CREATININE 0.83 10/20/2020 0950   CREATININE 0.78 11/08/2011 1121   CALCIUM 9.1 10/20/2020 0950   CALCIUM 8.6 11/08/2011 1121   PROT 7.0 10/20/2020 0950   ALBUMIN 4.5 10/20/2020 0950   AST 15 10/20/2020 0950   ALT 10 10/20/2020 0950   ALKPHOS 66 10/20/2020 0950   BILITOT 0.4 10/20/2020 0950   GFRNONAA 84 10/20/2020 0950   GFRNONAA >60 11/08/2011 1121   GFRAA 97 10/20/2020 0950    GFRAA >60 11/08/2011 1121   Lab Results  Component Value Date   WBC 7.4 10/20/2020   HGB 13.1 10/20/2020   HCT 38.6 10/20/2020   MCV 88 10/20/2020   PLT 214 10/20/2020    Imaging Studies: US ABDOMEN LIMITED RUQ (LIVER/GB)  Result Date: 11/08/2020 CLINICAL DATA:  Right upper quadrant pain, weight loss EXAM: ULTRASOUND ABDOMEN LIMITED RIGHT UPPER QUADRANT COMPARISON:  None. FINDINGS: Gallbladder: Mobile gallstones in the gallbladder. No gallbladder wall thickening. No pericholecystic fluid. No sonographic Murphy sign noted by sonographer. Common bile duct: Diameter: 2 mm Liver: No focal lesion identified. Within normal limits in parenchymal echogenicity. Portal vein is patent on color Doppler imaging with normal direction of blood flow towards the liver. Other: None. IMPRESSION: Cholelithiasis without sonographic evidence of acute cholecystitis. Electronically Signed   By: Eddie Candle M.D.   On: 11/08/2020 11:45    Assessment and Plan:   Carmen Logan is a 50 y.o. y/o female here for follow-up of abdominal pain  Patient continues to report burning sensation in chest She does have a small hiatal hernia and symptoms are consistent with likely acid reflux  We will start PPI once daily  (Risks of PPI use were discussed with patient including bone loss, C. Diff diarrhea, pneumonia, infections, CKD, electrolyte abnormalities.  Pt. Verbalizes understanding and chooses to continue the medication.)  Patient educated extensively on acid reflux lifestyle modification, including buying a bed wedge, not eating 3 hrs before bedtime, diet modifications, and handout given for the same.   If symptoms continue, consider referral for hiatal hernia surgery     Dr Vonda Antigua

## 2020-11-23 ENCOUNTER — Other Ambulatory Visit: Payer: Self-pay | Admitting: Family Medicine

## 2020-11-23 DIAGNOSIS — R110 Nausea: Secondary | ICD-10-CM

## 2020-11-24 ENCOUNTER — Other Ambulatory Visit: Payer: Self-pay

## 2020-11-25 ENCOUNTER — Other Ambulatory Visit: Payer: Self-pay | Admitting: Gastroenterology

## 2020-11-30 ENCOUNTER — Ambulatory Visit: Payer: Managed Care, Other (non HMO)

## 2020-12-09 ENCOUNTER — Telehealth: Payer: Self-pay

## 2020-12-09 DIAGNOSIS — K6289 Other specified diseases of anus and rectum: Secondary | ICD-10-CM

## 2020-12-09 NOTE — Telephone Encounter (Signed)
Referral was faxed to [facility] at [phone]. They will call the patient for an appointment. Larger than usual hypertrophied anal papillae. Evaluate for monitoring vs biopsy vs removal".

## 2021-01-04 ENCOUNTER — Other Ambulatory Visit: Payer: Self-pay

## 2021-01-04 ENCOUNTER — Encounter: Payer: Self-pay | Admitting: Gastroenterology

## 2021-01-04 ENCOUNTER — Ambulatory Visit (INDEPENDENT_AMBULATORY_CARE_PROVIDER_SITE_OTHER): Payer: Managed Care, Other (non HMO) | Admitting: Gastroenterology

## 2021-01-04 VITALS — BP 134/73 | HR 99 | Temp 98.9°F | Ht 62.0 in | Wt 98.0 lb

## 2021-01-04 DIAGNOSIS — K219 Gastro-esophageal reflux disease without esophagitis: Secondary | ICD-10-CM | POA: Diagnosis not present

## 2021-01-04 NOTE — Progress Notes (Signed)
Vonda Antigua, MD [address]  [place], [place] [address]  Main: [phone]  Fax: [phone]   Primary Care Physician: Olin Hauser, DO   Chief complaint: Abdominal pain  HPI: Carmen Logan is a 50 y.o. female here for follow-up of abdominal pain.  Patient reports complete resolution of symptoms within 2 days of starting PPI.  States is starting to eat better, and gain weight like she has been trying to for a while.  Is not using any more Tums.  No dysphagia, nausea or vomiting.  Previous history: Has undergone EGD and colonoscopy.  EGD showed gastric erythema, gastric mucosal atrophy and small hiatal hernia.  Colonoscopy showed hypertrophied anal papillae.  Pathology showed mild chronic and focal mild reactive gastritis, negative for H. pylori or intestinal metaplasia.  Current Outpatient Medications  Medication Sig Dispense Refill  . busPIRone (BUSPAR) 7.5 MG tablet TAKE 1 TABLET(7.5 MG) BY MOUTH TWICE DAILY AS NEEDED 180 tablet 1  . butalbital-acetaminophen-caffeine (FIORICET WITH CODEINE) 50-325-40-30 MG capsule TAKE 1 OR 2 CAPSULES BY MOUTH EVERY 4 HOURS AS NEEDED FOR HEADACHE OR MIGRAINE, NO MORE THAN 6 CAPSULES DAILY 30 capsule 2  . estradiol (ESTRACE) 1 MG tablet Take 1 tablet (1 mg total) by mouth daily. 90 tablet 3  . ondansetron (ZOFRAN-ODT) 4 MG disintegrating tablet DISSOLVE 1 TABLET(4 MG) ON THE TONGUE EVERY 8 HOURS AS NEEDED FOR NAUSEA OR VOMITING 30 tablet 2  . sertraline (ZOLOFT) 50 MG tablet Take 1.5 tablets (75 mg total) by mouth daily. 135 tablet 3  . traZODone (DESYREL) 50 MG tablet TAKE 1 TABLET BY MOUTH EVERY NIGHT AT BEDTIME. MAY START WITH 1/2 TABLET EVERY NIGHT AT BEDTIME IF PREFERRED 90 tablet 1  . omeprazole (PRILOSEC) 40 MG capsule Take 1 capsule (40 mg total) by mouth daily. 60 capsule 0   No current facility-administered medications for this visit.    Allergies as of 01/04/2021  . (No Known Allergies)     ROS:  General: Negative for anorexia, weight loss, fever, chills, fatigue, weakness. ENT: Negative for hoarseness, difficulty swallowing , nasal congestion. CV: Negative for chest pain, angina, palpitations, dyspnea on exertion, peripheral edema.  Respiratory: Negative for dyspnea at rest, dyspnea on exertion, cough, sputum, wheezing.  GI: See history of present illness. GU:  Negative for dysuria, hematuria, urinary incontinence, urinary frequency, nocturnal urination.  Endo: Negative for unusual weight change.    Physical Examination:   BP 134/73   Pulse 99   Temp 98.9 F (37.2 C) (Oral)   Ht 5' 2"  (1.575 m)   Wt 98 lb (44.5 kg)   BMI 17.92 kg/m   General: Well-nourished, well-developed in no acute distress.  Eyes: No icterus. Conjunctivae pink. Mouth: Oropharyngeal mucosa moist and pink , no lesions erythema or exudate. Neck: Supple, Trachea midline Abdomen: Bowel sounds are normal, nontender, nondistended, no hepatosplenomegaly or masses, no abdominal bruits or hernia , no rebound or guarding.   Extremities: No lower extremity edema. No clubbing or deformities. Neuro: Alert and oriented x 3.  Grossly intact. Skin: Warm and dry, no jaundice.   Psych: Alert and cooperative, normal mood and affect.   Labs: CMP     Component Value Date/Time   NA 142 10/20/2020 0950   NA 139 11/08/2011 1121   K 4.0 10/20/2020 0950   K 3.9 11/08/2011 1121   CL 103 10/20/2020 0950   CL 107 11/08/2011 1121   CO2 22 10/20/2020 0950   CO2 27  11/08/2011 1121   GLUCOSE 101 (H) 10/20/2020 0950   GLUCOSE 79 11/08/2011 1121   BUN 8 10/20/2020 0950   BUN 8 11/08/2011 1121   CREATININE 0.83 10/20/2020 0950   CREATININE 0.78 11/08/2011 1121   CALCIUM 9.1 10/20/2020 0950   CALCIUM 8.6 11/08/2011 1121   PROT 7.0 10/20/2020 0950   ALBUMIN 4.5 10/20/2020 0950   AST 15 10/20/2020 0950   ALT 10 10/20/2020 0950   ALKPHOS 66 10/20/2020 0950   BILITOT 0.4 10/20/2020 0950   GFRNONAA 84  10/20/2020 0950   GFRNONAA >60 11/08/2011 1121   GFRAA 97 10/20/2020 0950   GFRAA >60 11/08/2011 1121   Lab Results  Component Value Date   WBC 7.4 10/20/2020   HGB 13.1 10/20/2020   HCT 38.6 10/20/2020   MCV 88 10/20/2020   PLT 214 10/20/2020    Imaging Studies: No results found.  Assessment and Plan:   Carmen Logan is a 50 y.o. y/o female here for follow-up of abdominal pain with complete resolution of symptoms with PPI once daily use  Patient has dramatic improvement in her symptoms and eating much better with PPI use  Okay to continue once daily dosing for 2 months and then decrease to 20 mg once daily from 40 mg once daily and then discontinue if no further symptoms  Given that patient had dramatic symptoms prior to this, will not discontinue right away as that would likely lead to recurrent symptoms and poor oral intake as she had previously  (Risks of PPI use were discussed with patient including bone loss, C. Diff diarrhea, pneumonia, infections, CKD, electrolyte abnormalities.  Pt. Verbalizes understanding and chooses to continue the medication.)  Patient educated extensively on acid reflux lifestyle modification, including buying a bed wedge, not eating 3 hrs before bedtime, diet modifications, and handout given for the same.   She does have a small hiatal hernia and if symptoms recur hiatal hernia surgery referral can be considered  She has an upcoming appointment with [place] [facility] due to large hypertrophied anal papillae seen on colonoscopy.  I have advised her to discuss hiatal hernia with them as well so they are aware if symptoms do recur and she verbalized understanding    Dr Vonda Antigua

## 2021-01-12 ENCOUNTER — Other Ambulatory Visit: Payer: Self-pay | Admitting: Family Medicine

## 2021-01-12 DIAGNOSIS — G43109 Migraine with aura, not intractable, without status migrainosus: Secondary | ICD-10-CM

## 2021-01-12 NOTE — Telephone Encounter (Signed)
Requested medications are due for refill today yes  Requested medications are on the active medication list yes  Last refill 3/26  Last visit 09/2020 (this med/dx not addressed in OV note)  Future visit scheduled 03/14/21  Notes to clinic Not Delegated.

## 2021-03-01 ENCOUNTER — Other Ambulatory Visit: Payer: Self-pay | Admitting: Family Medicine

## 2021-03-01 DIAGNOSIS — F411 Generalized anxiety disorder: Secondary | ICD-10-CM

## 2021-03-14 ENCOUNTER — Ambulatory Visit (INDEPENDENT_AMBULATORY_CARE_PROVIDER_SITE_OTHER): Payer: Managed Care, Other (non HMO) | Admitting: Family Medicine

## 2021-03-14 ENCOUNTER — Other Ambulatory Visit: Payer: Self-pay

## 2021-03-14 ENCOUNTER — Encounter: Payer: Self-pay | Admitting: Family Medicine

## 2021-03-14 VITALS — BP 107/66 | HR 84 | Ht 62.0 in | Wt 104.0 lb

## 2021-03-14 DIAGNOSIS — Z0000 Encounter for general adult medical examination without abnormal findings: Secondary | ICD-10-CM | POA: Diagnosis not present

## 2021-03-14 DIAGNOSIS — F3340 Major depressive disorder, recurrent, in remission, unspecified: Secondary | ICD-10-CM

## 2021-03-14 DIAGNOSIS — F411 Generalized anxiety disorder: Secondary | ICD-10-CM

## 2021-03-14 DIAGNOSIS — G4726 Circadian rhythm sleep disorder, shift work type: Secondary | ICD-10-CM | POA: Diagnosis not present
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
## 2021-03-14 NOTE — Patient Instructions (Addendum)
  Please schedule a Follow-up Appointment to: Return in about 1 year (around 03/14/2022).  If you have any other questions or concerns, please feel free to call the office or send a message through [place]. You may also schedule an earlier appointment if necessary.  Additionally, you may be receiving a survey about your experience at our office within a few days to 1 week by e-mail or mail. We value your feedback.  Nobie Putnam, DO [place]

## 2021-03-16 LAB — COMPREHENSIVE METABOLIC PANEL
ALT: 8 IU/L (ref 0–32)
AST: 10 IU/L (ref 0–40)
Albumin/Globulin Ratio: 2 (ref 1.2–2.2)
Albumin: 4.5 g/dL (ref 3.8–4.8)
Alkaline Phosphatase: 74 IU/L (ref 44–121)
BUN/Creatinine Ratio: 9 (ref 9–23)
BUN: 8 mg/dL (ref 6–24)
Bilirubin Total: 0.4 mg/dL (ref 0.0–1.2)
CO2: 26 mmol/L (ref 20–29)
Calcium: 9.2 mg/dL (ref 8.7–10.2)
Chloride: 101 mmol/L (ref 96–106)
Creatinine, Ser: 0.94 mg/dL (ref 0.57–1.00)
Globulin, Total: 2.3 g/dL (ref 1.5–4.5)
Glucose: 88 mg/dL (ref 65–99)
Potassium: 4 mmol/L (ref 3.5–5.2)
Sodium: 140 mmol/L (ref 134–144)
Total Protein: 6.8 g/dL (ref 6.0–8.5)
eGFR: 75 mL/min/{1.73_m2} (ref >59)

## 2021-03-16 LAB — CBC WITH DIFFERENTIAL/PLATELET
Basophils Absolute: 0 10*3/uL (ref 0.0–0.2)
Basos: 1 %
EOS (ABSOLUTE): 0.1 10*3/uL (ref 0.0–0.4)
Eos: 1 %
Hematocrit: 37.3 % (ref 34.0–46.6)
Hemoglobin: 12.6 g/dL (ref 11.1–15.9)
Immature Grans (Abs): 0 10*3/uL (ref 0.0–0.1)
Immature Granulocytes: 0 %
Lymphocytes Absolute: 1.7 10*3/uL (ref 0.7–3.1)
Lymphs: 29 %
MCH: 30.1 pg (ref 26.6–33.0)
MCHC: 33.8 g/dL (ref 31.5–35.7)
MCV: 89 fL (ref 79–97)
Monocytes Absolute: 0.3 10*3/uL (ref 0.1–0.9)
Monocytes: 5 %
Neutrophils Absolute: 3.8 10*3/uL (ref 1.4–7.0)
Neutrophils: 64 %
Platelets: 197 10*3/uL (ref 150–450)
RBC: 4.19 x10E6/uL (ref 3.77–5.28)
RDW: 12.5 % (ref 11.7–15.4)
WBC: 5.9 10*3/uL (ref 3.4–10.8)

## 2021-03-16 LAB — LIPID PANEL
Chol/HDL Ratio: 3 ratio (ref 0.0–4.4)
Cholesterol, Total: 161 mg/dL (ref 100–199)
HDL: 53 mg/dL (ref >39)
LDL Chol Calc (NIH): 90 mg/dL (ref 0–99)
Triglycerides: 100 mg/dL (ref 0–149)
VLDL Cholesterol Cal: 18 mg/dL (ref 5–40)

## 2021-03-16 LAB — HEMOGLOBIN A1C
Est. average glucose Bld gHb Est-mCnc: 103 mg/dL
Hgb A1c MFr Bld: 5.2 % (ref 4.8–5.6)

## 2021-03-16 LAB — TSH: TSH: 2.36 u[IU]/mL (ref 0.450–4.500)

## 2021-03-16 LAB — T4, FREE: Free T4: 1.07 ng/dL (ref 0.82–1.77)

## 2021-04-14 ENCOUNTER — Other Ambulatory Visit: Payer: Self-pay | Admitting: Gastroenterology

## 2021-04-14 ENCOUNTER — Other Ambulatory Visit: Payer: Self-pay | Admitting: Family Medicine

## 2021-04-14 DIAGNOSIS — G43109 Migraine with aura, not intractable, without status migrainosus: Secondary | ICD-10-CM

## 2021-04-14 NOTE — Telephone Encounter (Signed)
Requested medication (s) are due for refill today:  yes   Requested medication (s) are on the active medication list:  yes   Last refill:  03/16/2021  Future visit scheduled: no  Notes to clinic:  this refill cannot be delegated    Requested Prescriptions  Pending Prescriptions Disp Refills   butalbital-acetaminophen-caffeine (FIORICET WITH CODEINE) 50-325-40-30 MG capsule [Pharmacy Med Name: BUTAL/ACETA/CAFF/COD 50-325-40-30MG] 30 capsule     Sig: TAKE 1 TO 2 CAPSULES BY MOUTH EVERY 4 HOURS AS NEEDED FOR HEADACHE OR MIGRAINE. NO MORE THAN 6 CAPSULES DAILY     Not Delegated - Analgesics:  Opioid Agonist Combinations Failed - 04/14/2021  7:05 AM      Failed - This refill cannot be delegated      Failed - Urine Drug Screen completed in last 360 days      Passed - Valid encounter within last 6 months    Recent Outpatient Visits           1 month ago Annual physical exam   [place], DO   5 months ago Unintentional weight loss   [place], DO   10 months ago Vasovagal syncope   [place], DO   1 year ago Annual physical exam   [place], DO   1 year ago Recurrent depressive disorder, current episode moderate [facility])   [facility] Olin Hauser, DO       Future Appointments             In 1 month Philip Aspen, CNM [facility]   In 3 months Virgel Manifold, MD [facility] [place]

## 2021-04-15 MED ORDER — OMEPRAZOLE 20 MG PO CPDR: 20.0000 mg | DELAYED_RELEASE_CAPSULE | Freq: Every day | ORAL | 2 refills | Status: DC

## 2021-05-25 ENCOUNTER — Encounter: Payer: Managed Care, Other (non HMO) | Admitting: Certified Nurse Midwife

## 2021-05-25 ENCOUNTER — Encounter: Payer: Self-pay | Admitting: Certified Nurse Midwife

## 2021-06-04 ENCOUNTER — Other Ambulatory Visit: Payer: Self-pay | Admitting: Family Medicine

## 2021-06-04 DIAGNOSIS — F3340 Major depressive disorder, recurrent, in remission, unspecified: Secondary | ICD-10-CM

## 2021-06-04 NOTE — Telephone Encounter (Signed)
Requested Prescriptions  Pending Prescriptions Disp Refills  . traZODone (DESYREL) 50 MG tablet [Pharmacy Med Name: TRAZODONE 50MG TABLETS] 90 tablet 1    Sig: TAKE 1 TABLET(50 MG) BY MOUTH AT BEDTIME     Psychiatry: Antidepressants - Serotonin Modulator Passed - 06/04/2021  9:50 AM      Passed - Completed PHQ-2 or PHQ-9 in the last 360 days      Passed - Valid encounter within last 6 months    Recent Outpatient Visits          2 months ago Annual physical exam   [place], DO   7 months ago Unintentional weight loss   [place], DO   11 months ago Vasovagal syncope   [place], DO   1 year ago Annual physical exam   [place], DO   1 year ago Recurrent depressive disorder, current episode moderate ([place])   [facility] Olin Hauser, DO      Future Appointments            In 1 month Bonna Gains, Lennette Bihari, MD [facility] [place]   In 1 month Philip Aspen, CNM [facility]

## 2021-06-15 DIAGNOSIS — K449 Diaphragmatic hernia without obstruction or gangrene: Secondary | ICD-10-CM

## 2021-06-28 ENCOUNTER — Other Ambulatory Visit: Payer: Self-pay | Admitting: Family Medicine

## 2021-06-28 DIAGNOSIS — G43109 Migraine with aura, not intractable, without status migrainosus: Secondary | ICD-10-CM

## 2021-06-28 NOTE — Telephone Encounter (Signed)
Requested medications are due for refill today yes  Last refill yes  Last visit 03/14/21  Future visit scheduled no  Notes to clinic This medication can not be delegated, please assess.   Requested Prescriptions  Pending Prescriptions Disp Refills   butalbital-acetaminophen-caffeine (FIORICET WITH CODEINE) 50-325-40-30 MG capsule [Pharmacy Med Name: BUT/ACETA/CAFF/COD CAP 50-325-40-30] 30 capsule     Sig: TAKE 1 TO 2 CAPSULES BY MOUTH EVERY 4 HOURS AS NEEDED FOR HEADACHE OR MIGRAINE. NO MORE THAN 6 CAPSULES DAILY     Not Delegated - Analgesics:  Opioid Agonist Combinations Failed - 06/28/2021 12:53 AM      Failed - This refill cannot be delegated      Failed - Urine Drug Screen completed in last 360 days      Passed - Valid encounter within last 6 months    Recent Outpatient Visits           3 months ago Annual physical exam   [place], DO   8 months ago Unintentional weight loss   [place], DO   1 year ago Vasovagal syncope   [place], DO   1 year ago Annual physical exam   [place], DO   1 year ago Recurrent depressive disorder, current episode moderate [facility])   [facility] Olin Hauser, DO       Future Appointments             In 2 weeks Virgel Manifold, MD  [facility] [place]   In 1 month Philip Aspen, CNM [facility]

## 2021-07-06 ENCOUNTER — Other Ambulatory Visit: Payer: Self-pay

## 2021-07-06 ENCOUNTER — Encounter: Payer: Self-pay | Admitting: Surgery

## 2021-07-06 ENCOUNTER — Ambulatory Visit (INDEPENDENT_AMBULATORY_CARE_PROVIDER_SITE_OTHER): Payer: Managed Care, Other (non HMO) | Admitting: Surgery

## 2021-07-06 VITALS — BP 132/80 | HR 52 | Temp 98.4°F | Ht 62.0 in | Wt 94.4 lb

## 2021-07-06 DIAGNOSIS — K8020 Calculus of gallbladder without cholecystitis without obstruction: Secondary | ICD-10-CM | POA: Diagnosis not present

## 2021-07-06 DIAGNOSIS — K449 Diaphragmatic hernia without obstruction or gangrene: Secondary | ICD-10-CM

## 2021-07-06 DIAGNOSIS — K219 Gastro-esophageal reflux disease without esophagitis: Secondary | ICD-10-CM

## 2021-07-06 NOTE — Patient Instructions (Addendum)
Please pick up your prep kit for the CT scan today across the [facility].  CT scan scheduled 07/29/21 @ 12:45 @ [place]. Barium Swallow study scheduled 08/05/21 @ 8:45 am @ [place]. Please see your follow up appointment listed below.  Hiatal Hernia A hiatal hernia occurs when part of the stomach slides above the muscle that separates the abdomen from the chest (diaphragm). A person can be born with a hiatal hernia (congenital), or it may develop over time. In almost all cases of hiatal hernia, only the top part of the stomach pushes through the diaphragm. Many people have a hiatal hernia with no symptoms. The larger the hernia, the more likely it is that you will have symptoms. In some cases, a hiatal hernia allows stomach acid to flow back into the tube that carries food from your mouth to your stomach (esophagus). This may cause heartburn symptoms. Severe heartburn symptoms may mean that you have developed a condition called gastroesophageal reflux disease (GERD). What are the causes? This condition is caused by a weakness in the opening (hiatus) where the esophagus passes through the diaphragm to attach to the upper part of the stomach. A person may be born with a weakness in the hiatus, or a weakness can develop over time. What increases the risk? This condition is more likely to develop in: Older people. Age is a major risk factor for a hiatal hernia, especially if you are over the age of 52. Pregnant women. People who are overweight. People who have frequent constipation. What are the signs or symptoms? Symptoms of this condition usually develop in the form of GERD symptoms. Symptoms include: Heartburn. Belching. Indigestion. Trouble swallowing. Coughing or wheezing. Sore throat. Hoarseness. Chest pain. Nausea and vomiting. How is this diagnosed? This condition may be diagnosed during testing for GERD. Tests that may be done include: X-rays of your stomach or chest. An  upper gastrointestinal (GI) series. This is an X-ray exam of your GI tract that is taken after you swallow a chalky liquid that shows up clearly on the X-ray. Endoscopy. This is a procedure to look into your stomach using a thin, flexible tube that has a tiny camera and light on the end of it. How is this treated? This condition may be treated by: Dietary and lifestyle changes to help reduce GERD symptoms. Medicines. These may include: Over-the-counter antacids. Medicines that make your stomach empty more quickly. Medicines that block the production of stomach acid (H2 blockers). Stronger medicines to reduce stomach acid (proton pump inhibitors). Surgery to repair the hernia, if other treatments are not helping. If you have no symptoms, you may not need treatment. Follow these instructions at home: Lifestyle and activity Do not use any products that contain nicotine or tobacco, such as cigarettes and e-cigarettes. If you need help quitting, ask your health care provider. Try to achieve and maintain a healthy body weight. Avoid putting pressure on your abdomen. Anything that puts pressure on your abdomen increases the amount of acid that may be pushed up into your esophagus. Avoid bending over, especially after eating. Raise the head of your bed by putting blocks under the legs. This keeps your head and esophagus higher than your stomach. Do not wear tight clothing around your chest or stomach. Try not to strain when having a bowel movement, when urinating, or when lifting heavy objects. Eating and drinking Avoid foods that can worsen GERD symptoms. These may include: Fatty foods, like fried foods. Citrus fruits, like oranges  or lemon. Other foods and drinks that contain acid, like orange juice or tomatoes. Spicy food. Chocolate. Eat frequent small meals instead of three large meals a day. This helps prevent your stomach from getting too full. Eat slowly. Do not lie down right after  eating. Do not eat 1-2 hours before bed. Do not drink beverages with caffeine. These include cola, coffee, cocoa, and tea. Do not drink alcohol. General instructions Take over-the-counter and prescription medicines only as told by your health care provider. Keep all follow-up visits as told by your health care provider. This is important. Contact a health care provider if: Your symptoms are not controlled with medicines or lifestyle changes. You are having trouble swallowing. You have coughing or wheezing that will not go away. Get help right away if: Your pain is getting worse. Your pain spreads to your arms, neck, jaw, teeth, or back. You have shortness of breath. You sweat for no reason. You feel sick to your stomach (nauseous) or you vomit. You vomit blood. You have bright red blood in your stools. You have black, tarry stools. Summary A hiatal hernia occurs when part of the stomach slides above the muscle that separates the abdomen from the chest (diaphragm). A person may be born with a weakness in the hiatus, or a weakness can develop over time. Symptoms of hiatal hernia may include heartburn, trouble swallowing, or sore throat. Management of hiatal hernia includes eating frequent small meals instead of three large meals a day. Get help right away if you vomit blood, have bright red blood in your stools, or have black, tarry stools. This information is not intended to replace advice given to you by your health care provider. Make sure you discuss any questions you have with your health care provider. Document Revised: 07/15/2020 Document Reviewed: 07/15/2020 Elsevier Patient Education  2022 [employer].

## 2021-07-07 ENCOUNTER — Encounter: Payer: Self-pay | Admitting: Surgery

## 2021-07-07 NOTE — Progress Notes (Signed)
Patient ID: Carmen Logan, female   DOB: 06-Apr-1971, 50 y.o.   MRN: [id number]  HPI Carmen Logan is a 50 y.o. female seen in consultation at the request of Dr. Bonna Gains for Hiatal hernia. She endorse severe reflux despite PPI Rx.  Emptiness that worsen when she lays supine.  She denies any dysphagia.  She does have an acid taste in her mouth. She Has been having reflux for last few months and has significant weight loss. She reports partial relief after PPI. I have personally reviewed images EGD showed gastric erythema, gastric mucosal atrophy and small hiatal hernia.  Colonoscopy showed hypertrophied anal papillae. She does have constipation and alternates with diarrhea .  Hx vaginal hysterectomy. She did have an ultrasound that I personally reviewed showing evidence of cholelithiasis without cholecystitis. Nml CBD CBC and CMP nml.   HPI  Past Medical History:  Diagnosis Date   Anxiety    Cervical dysplasia    Depression    Dyspareunia, female    Endometriosis    Facial skin lesion    HPV test positive    Migraine    "seems weather related"   Surgical menopause    Wears contact lenses    sometimes    Past Surgical History:  Procedure Laterality Date   BIOPSY N/A 11/16/2020   Procedure: BIOPSY;  Surgeon: Virgel Manifold, MD;  Location: [place];  Service: Endoscopy;  Laterality: N/A;   CERVICAL DISCECTOMY  03/17/2014   w/ fusion and plating   COLONOSCOPY WITH PROPOFOL N/A 11/16/2020   Procedure: COLONOSCOPY WITH PROPOFOL;  Surgeon: Virgel Manifold, MD;  Location: [place];  Service: Endoscopy;  Laterality: N/A;   CRYOTHERAPY     cervix   ESOPHAGOGASTRODUODENOSCOPY N/A 11/16/2020   Procedure: ESOPHAGOGASTRODUODENOSCOPY (EGD);  Surgeon: Virgel Manifold, MD;  Location: [place];  Service: Endoscopy;  Laterality: N/A;   VAGINAL HYSTERECTOMY  10/2009   Total vaginal hysterectomy, lso    Family History  Problem Relation Age of  Onset   Heart disease Mother    Hypertension Mother    Thyroid disease Mother    Heart disease Father    Heart attack Father 27   Diabetes Maternal Aunt    Diabetes Maternal Grandmother    Depression Sister    Cancer Neg Hx    Breast cancer Neg Hx     Social History Social History   Tobacco Use   Smoking status: Former    Types: Cigarettes    Quit date: 2015    Years since quitting: 7.8   Smokeless tobacco: Never   Tobacco comments:    Unsure duration  Vaping Use   Vaping Use: Former   Quit date: 02/25/2014  Substance Use Topics   Alcohol use: Yes    Comment: occasional   Drug use: No    No Known Allergies  Current Outpatient Medications  Medication Sig Dispense Refill   busPIRone (BUSPAR) 7.5 MG tablet TAKE 1 TABLET(7.5 MG) BY MOUTH TWICE DAILY AS NEEDED 180 tablet 0   butalbital-acetaminophen-caffeine (FIORICET WITH CODEINE) 50-325-40-30 MG capsule TAKE 1 TO 2 CAPSULES BY MOUTH EVERY 4 HOURS AS NEEDED FOR HEADACHE OR MIGRAINE. NO MORE THAN 6 CAPSULES DAILY 30 capsule 5   estradiol (ESTRACE) 1 MG tablet Take 1 tablet (1 mg total) by mouth daily. 90 tablet 3   omeprazole (PRILOSEC) 20 MG capsule Take 1 capsule (20 mg total) by mouth daily. 30 capsule 2   ondansetron (ZOFRAN-ODT) 4 MG  disintegrating tablet DISSOLVE 1 TABLET(4 MG) ON THE TONGUE EVERY 8 HOURS AS NEEDED FOR NAUSEA OR VOMITING 30 tablet 2   sertraline (ZOLOFT) 50 MG tablet Take 1.5 tablets (75 mg total) by mouth daily. 135 tablet 3   traZODone (DESYREL) 50 MG tablet TAKE 1 TABLET(50 MG) BY MOUTH AT BEDTIME 90 tablet 1   No current facility-administered medications for this visit.     Review of Systems Full ROS  was asked and was negative except for the information on the HPI  Physical Exam Blood pressure 132/80, pulse (!) 52, temperature 98.4 F (36.9 C), temperature source Oral, height 5' 2"  (1.575 m), weight 94 lb 6.4 oz (42.8 kg). CONSTITUTIONAL: NAD. EYES: Pupils are equal, round,  Sclera are  non-icteric. EARS, NOSE, MOUTH AND THROAT: SHE is wearing a mask. Hearing is intact to voice. LYMPH NODES:  Lymph nodes in the neck are normal. RESPIRATORY:  Lungs are clear. There is normal respiratory effort, with equal breath sounds bilaterally, and without pathologic use of accessory muscles. CARDIOVASCULAR: Heart is regular without murmurs, gallops, or rubs. GI: The abdomen is  soft,Epigastric tenderness, No Murphy, no peritonitis.. There are no palpable masses. There is no hepatosplenomegaly. There are normal bowel sounds in all quadrants. GU: Rectal deferred.   MUSCULOSKELETAL: Normal muscle strength and tone. No cyanosis or edema.   SKIN: Turgor is good and there are no pathologic skin lesions or ulcers. NEUROLOGIC: Motor and sensation is grossly normal. Cranial nerves are grossly intact. PSYCH:  Oriented to person, place and time. Affect is normal.  Data Reviewed  I have personally reviewed the patient's imaging, laboratory findings and medical records.    Assessment/Plan 50 year old female with significant reflux as well as a small hiatal hernia and evidence of cholelithiasis I do think that she does have evidence of some IBS and functional GI issue.  In addition to this she may have symptomatic cholelithiasis.  I do personally believe that she has 3 different separate GI issues ( GERD w HH, + GS and IBS). We will continue w/u w CT A/P as well as Barium swallow. D/W her that we may need to do a stepwise approach.  May need cholecystectomy first and then re-evaluate.  Complex situation and my thoughts were shared with the pt in detail.  I will see her once she completes further w/u. A copy of this report was sent to our referring provider. I spent > 60 min including coordination of care, counseling the pt, placing orders  and performing appropriate documentation.     Caroleen Hamman, MD FACS General Surgeon 07/07/2021, 5:21 PM

## 2021-07-09 ENCOUNTER — Emergency Department
Admission: EM | Admit: 2021-07-09 | Discharge: 2021-07-09 | Disposition: A | Discharge status: Home / Self Care | Payer: Managed Care, Other (non HMO) | Attending: Emergency Medicine | Admitting: Emergency Medicine

## 2021-07-09 ENCOUNTER — Other Ambulatory Visit: Payer: Self-pay

## 2021-07-09 ENCOUNTER — Emergency Department: Payer: Managed Care, Other (non HMO)

## 2021-07-09 DIAGNOSIS — S91312A Laceration without foreign body, left foot, initial encounter: Secondary | ICD-10-CM | POA: Insufficient documentation

## 2021-07-09 DIAGNOSIS — W540XXA Bitten by dog, initial encounter: Secondary | ICD-10-CM | POA: Diagnosis not present

## 2021-07-09 DIAGNOSIS — Z23 Encounter for immunization: Secondary | ICD-10-CM | POA: Diagnosis not present

## 2021-07-09 DIAGNOSIS — Z87891 Personal history of nicotine dependence: Secondary | ICD-10-CM | POA: Diagnosis not present

## 2021-07-09 DIAGNOSIS — S91352A Open bite, left foot, initial encounter: Secondary | ICD-10-CM | POA: Diagnosis present

## 2021-07-09 DIAGNOSIS — S99922A Unspecified injury of left foot, initial encounter: Secondary | ICD-10-CM

## 2021-07-09 MED ORDER — TETANUS-DIPHTH-ACELL PERTUSSIS 5-2.5-18.5 LF-MCG/0.5 IM SUSY
0.5000 mL | PREFILLED_SYRINGE | Freq: Once | INTRAMUSCULAR | Status: AC
  Administered 2021-07-09: 0.5 mL via INTRAMUSCULAR
  Filled 2021-07-09: qty 0.5

## 2021-07-09 MED ORDER — AMOXICILLIN-POT CLAVULANATE 875-125 MG PO TABS
1.0000 | ORAL_TABLET | Freq: Once | ORAL | Status: AC
  Administered 2021-07-09: 1 via ORAL
  Filled 2021-07-09: qty 1

## 2021-07-09 MED ORDER — HYDROCODONE-ACETAMINOPHEN 5-325 MG PO TABS: 1.0000 | ORAL_TABLET | Freq: Three times a day (TID) | ORAL | 0 refills | Status: AC | PRN

## 2021-07-09 MED ORDER — AMOXICILLIN-POT CLAVULANATE 875-125 MG PO TABS: 1.0000 | ORAL_TABLET | Freq: Two times a day (BID) | ORAL | 0 refills | Status: AC

## 2021-07-09 MED ORDER — ONDANSETRON 4 MG PO TBDP
4.0000 mg | ORAL_TABLET | Freq: Once | ORAL | Status: AC
  Administered 2021-07-09: 4 mg via ORAL
  Filled 2021-07-09: qty 1

## 2021-07-09 MED ORDER — OXYCODONE-ACETAMINOPHEN 5-325 MG PO TABS
1.0000 | ORAL_TABLET | Freq: Once | ORAL | Status: AC
  Administered 2021-07-09: 1 via ORAL
  Filled 2021-07-09: qty 1

## 2021-07-09 NOTE — ED Provider Notes (Signed)
Emergency Medicine Provider Triage Evaluation Note  Carmen Logan, a 50 y.o. female  was evaluated in triage.  Pt complains of bite to the left foot.  Patient was attempting to break up an altercation between her large puppy and her smaller dog, and inadvertently got bitten on the left great toe along with the second and third toes.  She presents with bleeding controlled with a large laceration noted. She is unclear of her tetanus status.   Review of Systems  Positive: Dog bite to left foot Negative: CP, SOB  Physical Exam  BP 133/76 (BP Location: Left Arm)   Pulse 82   Temp 98.4 F (36.9 C) (Oral)   Resp 18   Ht 5' 2"  (1.575 m)   Wt 42.6 kg   SpO2 100%   BMI 17.19 kg/m  Gen:   Awake, no distress  NAD Resp:  Normal effort CTA MSK:   Moves extremities without difficulty Left foot with large linear laceration to the 1st interspace. Multiple small lacerations to the lateral 1 MTP. Nor nail injury. Normal toe ROM Other:  CVS: RRR. Normal distal pulses and cap refill  Medical Decision Making  Medically screening exam initiated at 6:08 PM.  Appropriate orders placed.  JURNEY OVERACKER was informed that the remainder of the evaluation will be completed by another provider, this initial triage assessment does not replace that evaluation, and the importance of remaining in the ED until their evaluation is complete.  Patient with ED evaluation of a dog bite to the left foot.    Melvenia Needles, PA-C 07/09/21 1818    Naaman Plummer, MD 07/13/21 [phone]

## 2021-07-09 NOTE — Discharge Instructions (Addendum)
Take antibiotics as directed and pain medicine as needed.  Keep the wounds clean, dry, and covered.  Wear the boot to ambulate as needed.  Follow-up with your primary provider or podiatry for any concerning symptoms peer return to the ED for signs of worsening infection.

## 2021-07-09 NOTE — ED Triage Notes (Signed)
Pt states that she broke up a dog fight between her dogs- pt states dogs are up to date on shots- pt got bit on her L foot by one of her dogs- foot covered

## 2021-07-15 NOTE — ED Provider Notes (Signed)
[facility] Emergency Department Provider Note ____________________________________________  Time seen: 1818  I have reviewed the triage vital signs and the nursing notes.  HISTORY  Chief Complaint  Animal Bite and Foot Injury  HPI Carmen Logan is a 50 y.o. female presents to the ED after accidental laceration to the right foot.  Patient describes her large pitbull dog bit her on the foot, as her husband was trying to separate the 2 dogs.  She presents with laceration between the first and second toes but denies any nailbed avulsion.  She is unclear of her tetanus status at this time.  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   Insomnia 03/08/2020   GAD (generalized anxiety disorder) 09/02/2018   Major depressive disorder, recurrent, in remission ([place]) 09/02/2018   Chronic migraine with aura 09/02/2018   Surgical menopause 08/04/2015   HPV test positive 08/04/2015   Status post vaginal hysterectomy 08/04/2015   History of endometriosis 08/04/2015    Past Surgical History:  Procedure Laterality Date   BIOPSY N/A 11/16/2020   Procedure: BIOPSY;  Surgeon: Virgel Manifold, MD;  Location: [place];  Service: Endoscopy;  Laterality: N/A;   CERVICAL DISCECTOMY  03/17/2014   w/ fusion and plating   COLONOSCOPY WITH PROPOFOL N/A 11/16/2020   Procedure: COLONOSCOPY WITH PROPOFOL;  Surgeon: Virgel Manifold, MD;  Location: [place];  Service: Endoscopy;  Laterality: N/A;   CRYOTHERAPY     cervix   ESOPHAGOGASTRODUODENOSCOPY N/A 11/16/2020    Procedure: ESOPHAGOGASTRODUODENOSCOPY (EGD);  Surgeon: Virgel Manifold, MD;  Location: [place];  Service: Endoscopy;  Laterality: N/A;   VAGINAL HYSTERECTOMY  10/2009   Total vaginal hysterectomy, lso    Prior to Admission medications   Medication Sig Start Date End Date Taking? Authorizing Provider  amoxicillin-clavulanate (AUGMENTIN) 875-125 MG tablet Take 1 tablet by mouth 2 (two) times daily for 10 days. 07/09/21 07/19/21 Yes Pavle Wiler, Dannielle Karvonen, PA-C  busPIRone (BUSPAR) 7.5 MG tablet TAKE 1 TABLET(7.5 MG) BY MOUTH TWICE DAILY AS NEEDED 03/01/21   Karamalegos, Devonne Doughty, DO  butalbital-acetaminophen-caffeine (FIORICET WITH CODEINE) 50-325-40-30 MG capsule TAKE 1 TO 2 CAPSULES BY MOUTH EVERY 4 HOURS AS NEEDED FOR HEADACHE OR MIGRAINE. NO MORE THAN 6 CAPSULES DAILY 06/28/21   Parks Ranger, Devonne Doughty, DO  estradiol (ESTRACE) 1 MG tablet Take 1 tablet (1 mg total) by mouth daily. 05/19/20   Philip Aspen, CNM  omeprazole (PRILOSEC) 20 MG capsule Take 1 capsule (20 mg total) by mouth daily. 04/15/21   Virgel Manifold, MD  ondansetron (ZOFRAN-ODT) 4 MG disintegrating tablet DISSOLVE 1 TABLET(4 MG) ON THE TONGUE EVERY 8 HOURS AS NEEDED FOR NAUSEA OR VOMITING 11/23/20   Parks Ranger, Devonne Doughty, DO  sertraline (ZOLOFT) 50 MG tablet Take 1.5 tablets (75 mg total) by mouth daily. 10/19/20   Parks Ranger, Devonne Doughty, DO  traZODone (DESYREL) 50 MG tablet TAKE 1 TABLET(50 MG) BY MOUTH AT BEDTIME 06/04/21  Olin Hauser, DO    Allergies Patient has no known allergies.  Family History  Problem Relation Age of Onset   Heart disease Mother    Hypertension Mother    Thyroid disease Mother    Heart disease Father    Heart attack Father 7   Diabetes Maternal Aunt    Diabetes Maternal Grandmother    Depression Sister    Cancer Neg Hx    Breast cancer Neg Hx     Social History Social History   Tobacco Use   Smoking status: Former    Types: Cigarettes    Quit  date: 2015    Years since quitting: 7.8   Smokeless tobacco: Never   Tobacco comments:    Unsure duration  Vaping Use   Vaping Use: Former   Quit date: 02/25/2014  Substance Use Topics   Alcohol use: Yes    Comment: occasional   Drug use: No    Review of Systems  Constitutional: Negative for fever. Eyes: Negative for visual changes. ENT: Negative for sore throat. Cardiovascular: Negative for chest pain. Respiratory: Negative for shortness of breath. Gastrointestinal: Negative for abdominal pain, vomiting and diarrhea. Genitourinary: Negative for dysuria. Musculoskeletal: Negative for back pain. Skin: Negative for rash.  Left foot laceration/dog bite Neurological: Negative for headaches, focal weakness or numbness. ____________________________________________  PHYSICAL EXAM:  VITAL SIGNS: ED Triage Vitals [07/09/21 1801]  Enc Vitals Group     BP 133/76     Pulse Rate 82     Resp 18     Temp 98.4 F (36.9 C)     Temp Source Oral     SpO2 100 %     Weight 94 lb (42.6 kg)     Height 5' 2"  (1.575 m)     Head Circumference      Peak Flow      Pain Score 10     Pain Loc      Pain Edu?      Excl. in [place]?     Constitutional: Alert and oriented. Well appearing and in no distress. Head: Normocephalic and atraumatic. Eyes: Conjunctivae are normal. Normal extraocular movements Cardiovascular: Normal rate, regular rhythm. Normal distal pulses. Respiratory: Normal respiratory effort. No wheezes/rales/rhonchi. Gastrointestinal: Soft and nontender. No distention. Musculoskeletal: Nontender with normal range of motion in all extremities.  Left foot without obvious deformity, Dislocation, or nail avulsion.  Laceration noted between the first and second toes without gaping or dehiscence.  No active bleeding at this time. Neurologic:  Normal gait without ataxia. Normal speech and language. No gross focal neurologic deficits are appreciated. Skin:  Skin is warm, dry and intact. No  rash noted. ____________________________________________    {LABS (pertinent positives/negatives)  ____________________________________________  {EKG  ____________________________________________   RADIOLOGY Official radiology report(s): No results found. ____________________________________________  PROCEDURES  Tdap 0.5 ml IM Augmentin 875 mg PO Percocet 5-325 mg PO Zofran 4 mg ODT Wound care Post op shoe  .Marland KitchenLaceration Repair  Date/Time: 07/09/2021 7:07 PM Performed by: Melvenia Needles, PA-C Authorized by: Melvenia Needles, PA-C   Consent:    Consent obtained:  Verbal   Consent given by:  Patient   Risks, benefits, and alternatives were discussed: yes     Risks discussed:  Infection and pain   Alternatives discussed:  Delayed treatment Universal protocol:    Imaging studies available: yes     Patient identity confirmed:  Verbally with patient Anesthesia:    Anesthesia method:  None  Laceration details:    Location:  Foot   Foot location:  Top of L foot   Length (cm):  4   Depth (mm):  3 Exploration:    Limited defect created (wound extended): no     Contaminated: no   Treatment:    Area cleansed with:  Saline   Amount of cleaning:  Standard   Irrigation solution:  Sterile saline   Irrigation volume:  10   Irrigation method:  Syringe   Debridement:  None   Undermining:  None   Scar revision: no   Skin repair:    Repair method:  Steri-Strips   Number of Steri-Strips:  5 Approximation:    Approximation:  Close Repair type:    Repair type:  Simple Post-procedure details:    Dressing:  Bulky dressing   Procedure completion:  Tolerated ____________________________________________   INITIAL IMPRESSION / ASSESSMENT AND PLAN / ED COURSE  As part of my medical decision making, I reviewed the following data within the electronic MEDICAL RECORD NUMBER Radiograph reviewed WNL and Notes from prior ED visits    DDX: foot fracture, retained  foreign body, nail avulsion  Patient ED evaluation of accidental, unintentional laceration to the left foot at the first interspace after her dog bit her on the foot.  Patient presents to the ED with bleeding controlled and noting pain to the foot.  X-ray does not dictate any retained foreign body, open fracture, or dislocation.  Patient's tetanus is updated and she consents to wound care.  The wound is covered with Steri-Strips and nonstick dressing.  Patient is placed in a postop shoe to ambulate.  She will follow with a primary provider for interim wound care.  Prescription for Augmentin is provided as well as Norco for pain relief.  Return precautions of been reviewed.   CAITLYN BUCHANAN was evaluated in Emergency Department on 07/15/2021 for the symptoms described in the history of present illness. She was evaluated in the context of the global COVID-19 pandemic, which necessitated consideration that the patient might be at risk for infection with the SARS-CoV-2 virus that causes COVID-19. Institutional protocols and algorithms that pertain to the evaluation of patients at risk for COVID-19 are in a state of rapid change based on information released by regulatory bodies including the CDC and federal and state organizations. These policies and algorithms were followed during the patient's care in the ED.  I reviewed the patient's prescription history over the last 12 months in the multi-state controlled substances database(s) that includes [place], [place], [place], [place], [place], [place], [place], [place], [place], [place], [place], [place], [place], and [place].  Results were notable for monthly RX noted. ____________________________________________  FINAL CLINICAL IMPRESSION(S) / ED DIAGNOSES  Final diagnoses:  Dog bite, initial encounter  Injury of left foot, initial encounter      Melvenia Needles, PA-C 07/15/21 1909    Carrie Mew,  MD 07/16/21 1414

## 2021-07-18 ENCOUNTER — Ambulatory Visit: Payer: Managed Care, Other (non HMO) | Admitting: Gastroenterology

## 2021-07-25 ENCOUNTER — Other Ambulatory Visit: Payer: Self-pay | Admitting: Certified Nurse Midwife

## 2021-07-25 DIAGNOSIS — E8940 Asymptomatic postprocedural ovarian failure: Secondary | ICD-10-CM

## 2021-07-29 ENCOUNTER — Other Ambulatory Visit: Payer: Self-pay

## 2021-07-29 ENCOUNTER — Ambulatory Visit: Payer: Managed Care, Other (non HMO)

## 2021-07-29 DIAGNOSIS — K449 Diaphragmatic hernia without obstruction or gangrene: Secondary | ICD-10-CM

## 2021-07-29 NOTE — Progress Notes (Signed)
ct 

## 2021-08-01 ENCOUNTER — Encounter: Payer: Self-pay | Admitting: Certified Nurse Midwife

## 2021-08-01 ENCOUNTER — Other Ambulatory Visit: Payer: Self-pay

## 2021-08-01 ENCOUNTER — Ambulatory Visit (INDEPENDENT_AMBULATORY_CARE_PROVIDER_SITE_OTHER): Payer: Managed Care, Other (non HMO) | Admitting: Certified Nurse Midwife

## 2021-08-01 VITALS — BP 113/77 | HR 55 | Ht 62.0 in | Wt 93.4 lb

## 2021-08-01 DIAGNOSIS — E8940 Asymptomatic postprocedural ovarian failure: Secondary | ICD-10-CM

## 2021-08-01 DIAGNOSIS — Z01419 Encounter for gynecological examination (general) (routine) without abnormal findings: Secondary | ICD-10-CM

## 2021-08-01 MED ORDER — ESTRADIOL 1 MG PO TABS: ORAL_TABLET | ORAL | 3 refills | Status: DC

## 2021-08-01 NOTE — Progress Notes (Signed)
GYNECOLOGY ANNUAL PREVENTATIVE CARE ENCOUNTER NOTE  History:     Carmen Logan is a 50 y.o. G21P2002 female here for a routine annual gynecologic exam.  Current complaints: vaginal dryness, some splitting of tissue with intercourse.   Denies abnormal vaginal bleeding, discharge, pelvic pain, problems with intercourse or other gynecologic concerns.     Social Relationship: married  Living: spouse  Work: Glass blower/designer Exercise: none  Smoke/Alcohol/drug use: rare alcohol use   Gynecologic History No LMP recorded. Patient has had a hysterectomy. Contraception: status post hysterectomy Last Pap: 2016. Results were: normal with negative HPV Last mammogram: 07/16/2021. Results were: normal  Obstetric History OB History  Gravida Para Term Preterm AB Living  2 2 2     2   SAB IAB Ectopic Multiple Live Births          2    # Outcome Date GA Lbr Len/2nd Weight Sex Delivery Anes PTL Lv  2 Term 1999   6 lb 14.4 oz (3.13 kg) F Vag-Spont   LIV  1 Term 1995   6 lb 4.8 oz (2.858 kg) F Vag-Spont   LIV    Past Medical History:  Diagnosis Date   Anxiety    Cervical dysplasia    Depression    Dyspareunia, female    Endometriosis    Facial skin lesion    HPV test positive    Migraine    "seems weather related"   Surgical menopause    Wears contact lenses    sometimes    Past Surgical History:  Procedure Laterality Date   BIOPSY N/A 11/16/2020   Procedure: BIOPSY;  Surgeon: Virgel Manifold, MD;  Location: [place];  Service: Endoscopy;  Laterality: N/A;   CERVICAL DISCECTOMY  03/17/2014   w/ fusion and plating   COLONOSCOPY WITH PROPOFOL N/A 11/16/2020   Procedure: COLONOSCOPY WITH PROPOFOL;  Surgeon: Virgel Manifold, MD;  Location: [place];  Service: Endoscopy;  Laterality: N/A;   CRYOTHERAPY     cervix   ESOPHAGOGASTRODUODENOSCOPY N/A 11/16/2020   Procedure: ESOPHAGOGASTRODUODENOSCOPY (EGD);  Surgeon: Virgel Manifold, MD;  Location: [place];  Service: Endoscopy;  Laterality: N/A;   VAGINAL HYSTERECTOMY  10/2009   Total vaginal hysterectomy, lso    Current Outpatient Medications on File Prior to Visit  Medication Sig Dispense Refill   busPIRone (BUSPAR) 7.5 MG tablet TAKE 1 TABLET(7.5 MG) BY MOUTH TWICE DAILY AS NEEDED 180 tablet 0   butalbital-acetaminophen-caffeine (FIORICET WITH CODEINE) 50-325-40-30 MG capsule TAKE 1 TO 2 CAPSULES BY MOUTH EVERY 4 HOURS AS NEEDED FOR HEADACHE OR MIGRAINE. NO MORE THAN 6 CAPSULES DAILY 30 capsule 5   estradiol (ESTRACE) 1 MG tablet TAKE 1 TABLET(1 MG) BY MOUTH DAILY 90 tablet 3   omeprazole (PRILOSEC) 20 MG capsule Take 1 capsule (20 mg total) by mouth daily. 30 capsule 2   ondansetron (ZOFRAN-ODT) 4 MG disintegrating tablet DISSOLVE 1 TABLET(4 MG) ON THE TONGUE EVERY 8 HOURS AS NEEDED FOR NAUSEA OR VOMITING 30 tablet 2   sertraline (ZOLOFT) 50 MG tablet Take 1.5 tablets (75 mg total) by mouth daily. 135 tablet 3   traZODone (DESYREL) 50 MG tablet TAKE 1 TABLET(50 MG) BY MOUTH AT BEDTIME 90 tablet 1   No current facility-administered medications on file prior to visit.    No Known Allergies  Social History:  reports that she quit smoking about 7 years ago. Her smoking use included cigarettes. She has never used smokeless  tobacco. She reports current alcohol use. She reports that she does not use drugs.  Family History  Problem Relation Age of Onset   Heart disease Mother    Hypertension Mother    Thyroid disease Mother    Heart disease Father    Heart attack Father 75   Diabetes Maternal Aunt    Diabetes Maternal Grandmother    Depression Sister    Cancer Neg Hx    Breast cancer Neg Hx     The following portions of the patient's history were reviewed and updated as appropriate: allergies, current medications, past family history, past medical history, past social history, past surgical history and problem list.  Review of Systems Pertinent items noted in HPI and  remainder of comprehensive ROS otherwise negative.  Physical Exam:  BP 113/77   Pulse (!) 55   Ht 5' 2"  (1.575 m)   Wt 93 lb 6.4 oz (42.4 kg)   BMI 17.08 kg/m  CONSTITUTIONAL: Well-developed, well-nourished female in no acute distress.  HENT:  Normocephalic, atraumatic, External right and left ear normal. Oropharynx is clear and moist EYES: Conjunctivae and EOM are normal. Pupils are equal, round, and reactive to light. No scleral icterus.  NECK: Normal range of motion, supple, no masses.  Normal thyroid.  SKIN: Skin is warm and dry. No rash noted. Not diaphoretic. No erythema. No pallor. MUSCULOSKELETAL: Normal range of motion. No tenderness.  No cyanosis, clubbing, or edema.  2+ distal pulses. NEUROLOGIC: Alert and oriented to person, place, and time. Normal reflexes, muscle tone coordination.  PSYCHIATRIC: Normal mood and affect. Normal behavior. Normal judgment and thought content. CARDIOVASCULAR: Normal heart rate noted, regular rhythm RESPIRATORY: Clear to auscultation bilaterally. Effort and breath sounds normal, no problems with respiration noted. BREASTS: Symmetric in size. No masses, tenderness, skin changes, nipple drainage, or lymphadenopathy bilaterally.  ABDOMEN: Soft, no distention noted.  No tenderness, rebound or guarding.  PELVIC: Normal appearing external genitalia and urethral meatus; normal appearing vaginal mucosa and cervix.  No abnormal discharge noted.  Pap smear n/a .  Normal uterine size, no other palpable masses, no uterine or adnexal tenderness.  .   Assessment and Plan:    1. Well woman exam with routine gynecological exam   Pap: n/a  Mammogram :ordered for next year Labs: none  Refills: estrace  Declines flu Referral: none  Discussed use of lubercations she state she use with intercourse. Discussed vaginal moisturizer and or use of estrogen cream. She declines estrogen at this time  Routine preventative health maintenance measures emphasized. Please  refer to After Visit Summary for other counseling recommendations.      Philip Aspen, CNM [facility]

## 2021-08-04 ENCOUNTER — Ambulatory Visit: Payer: Managed Care, Other (non HMO)

## 2021-08-04 ENCOUNTER — Ambulatory Visit
Admission: RE | Admit: 2021-08-04 | Discharge: 2021-08-04 | Disposition: A | Discharge status: Home / Self Care | Payer: Managed Care, Other (non HMO) | Source: Ambulatory Visit | Attending: Surgery | Admitting: Surgery

## 2021-08-04 ENCOUNTER — Telehealth: Payer: Self-pay

## 2021-08-04 DIAGNOSIS — K449 Diaphragmatic hernia without obstruction or gangrene: Secondary | ICD-10-CM | POA: Diagnosis present

## 2021-08-04 MED ORDER — IOHEXOL 300 MG/ML  SOLN
100.0000 mL | Freq: Once | INTRAMUSCULAR | Status: AC | PRN
  Administered 2021-08-04: 100 mL via INTRAVENOUS

## 2021-08-04 NOTE — Telephone Encounter (Signed)
[facility] calling with results from CT Abdomen 08/03/2021. Spoke with Dr.Pabon-was asked to call patient to see how she is feeling- patient states she is having constipation, and nausea. No vomiting. She is passing gas. No fever.Patient instructed to drink plenty of fluids to help flush the contrast out of her system.  She is scheduled to have Barium swallow tomorrow -however will need to be rescheduled due to having contrast from the CT. Also will need to reschedule appointment with Dr.Pabon.   Barium Swallow study scheduled 8:45 [facility] 08/12/21.   Dr.Pabon 08/15/21 @ 8:45.  Patient notified of all information listed above.

## 2021-08-05 ENCOUNTER — Other Ambulatory Visit: Payer: Managed Care, Other (non HMO)

## 2021-08-10 ENCOUNTER — Ambulatory Visit: Payer: Managed Care, Other (non HMO) | Admitting: Surgery

## 2021-08-12 ENCOUNTER — Ambulatory Visit
Admission: RE | Admit: 2021-08-12 | Discharge: 2021-08-12 | Disposition: A | Discharge status: Home / Self Care | Payer: Managed Care, Other (non HMO) | Source: Ambulatory Visit | Attending: Surgery | Admitting: Surgery

## 2021-08-12 ENCOUNTER — Other Ambulatory Visit: Payer: Self-pay

## 2021-08-12 DIAGNOSIS — K449 Diaphragmatic hernia without obstruction or gangrene: Secondary | ICD-10-CM | POA: Diagnosis present

## 2021-08-15 ENCOUNTER — Encounter: Payer: Self-pay | Admitting: Surgery

## 2021-08-15 ENCOUNTER — Ambulatory Visit: Payer: Managed Care, Other (non HMO) | Admitting: Surgery

## 2021-08-15 ENCOUNTER — Other Ambulatory Visit: Payer: Self-pay

## 2021-08-15 ENCOUNTER — Telehealth: Payer: Self-pay | Admitting: Surgery

## 2021-08-15 VITALS — BP 112/75 | HR 83 | Temp 98.0°F | Ht 62.0 in | Wt 94.0 lb

## 2021-08-15 DIAGNOSIS — K449 Diaphragmatic hernia without obstruction or gangrene: Secondary | ICD-10-CM

## 2021-08-15 DIAGNOSIS — K8020 Calculus of gallbladder without cholecystitis without obstruction: Secondary | ICD-10-CM

## 2021-08-15 DIAGNOSIS — K219 Gastro-esophageal reflux disease without esophagitis: Secondary | ICD-10-CM

## 2021-08-15 NOTE — Patient Instructions (Addendum)
You have requested to have your gallbladder removed. This will be done at [facility] with Dr. Dahlia Byes.  You will most likely be out of work 1-2 weeks for this surgery. You will return after your post-op appointment with a lifting restriction for approximately 4 more weeks.  You will be able to eat anything you would like to following surgery. But, start by eating a bland diet and advance this as tolerated. The Gallbladder diet is below, please go as closely by this diet as possible prior to surgery to avoid any further attacks.  Please see the (blue)pre-care form that you have been given today.  Our surgery scheduler will call you to look at surgery dates and to go over information.   If you have any questions, please call our office.  Laparoscopic Cholecystectomy Laparoscopic cholecystectomy is surgery to remove the gallbladder. The gallbladder is located in the upper right part of the abdomen, behind the liver. It is a storage sac for bile, which is produced in the liver. Bile aids in the digestion and absorption of fats. Cholecystectomy is often done for inflammation of the gallbladder (cholecystitis). This condition is usually caused by a buildup of gallstones (cholelithiasis) in the gallbladder. Gallstones can block the flow of bile, and that can result in inflammation and pain. In severe cases, emergency surgery may be required. If emergency surgery is not required, you will have time to prepare for the procedure. Laparoscopic surgery is an alternative to open surgery. Laparoscopic surgery has a shorter recovery time. Your common bile duct may also need to be examined during the procedure. If stones are found in the common bile duct, they may be removed. LET [facility] CARE PROVIDER KNOW ABOUT: Any allergies you have. All medicines you are taking, including vitamins, herbs, eye drops, creams, and over-the-counter medicines. Previous problems you or members of your family have had with  the use of anesthetics. Any blood disorders you have. Previous surgeries you have had.  Any medical conditions you have. RISKS AND COMPLICATIONS Generally, this is a safe procedure. However, problems may occur, including: Infection. Bleeding. Allergic reactions to medicines. Damage to other structures or organs. A stone remaining in the common bile duct. A bile leak from the cyst duct that is clipped when your gallbladder is removed. The need to convert to open surgery, which requires a larger incision in the abdomen. This may be necessary if your surgeon thinks that it is not safe to continue with a laparoscopic procedure. BEFORE THE PROCEDURE Ask your health care provider about: Changing or stopping your regular medicines. This is especially important if you are taking diabetes medicines or blood thinners. Taking medicines such as aspirin and ibuprofen. These medicines can thin your blood. Do not take these medicines before your procedure if your health care provider instructs you not to. Follow instructions from your health care provider about eating or drinking restrictions. Let your health care provider know if you develop a cold or an infection before surgery. Plan to have someone take you home after the procedure. Ask your health care provider how your surgical site will be marked or identified. You may be given antibiotic medicine to help prevent infection. PROCEDURE To reduce your risk of infection: Your health care team will wash or sanitize their hands. Your skin will be washed with soap. An IV tube may be inserted into one of your veins. You will be given a medicine to make you fall asleep (general anesthetic). A breathing tube will be  placed in your mouth. The surgeon will make several small cuts (incisions) in your abdomen. A thin, lighted tube (laparoscope) that has a tiny camera on the end will be inserted through one of the small incisions. The camera on the  laparoscope will send a picture to a TV screen (monitor) in the operating room. This will give the surgeon a good view inside your abdomen. A gas will be pumped into your abdomen. This will expand your abdomen to give the surgeon more room to perform the surgery. Other tools that are needed for the procedure will be inserted through the other incisions. The gallbladder will be removed through one of the incisions. After your gallbladder has been removed, the incisions will be closed with stitches (sutures), staples, or skin glue. Your incisions may be covered with a bandage (dressing). The procedure may vary among health care providers and hospitals. AFTER THE PROCEDURE Your blood pressure, heart rate, breathing rate, and blood oxygen level will be monitored often until the medicines you were given have worn off. You will be given medicines as needed to control your pain.   This information is not intended to replace advice given to you by your health care provider. Make sure you discuss any questions you have with your health care provider.   Document Released: 08/14/2005 Document Revised: 05/05/2015 Document Reviewed: 03/26/2013 Elsevier Interactive Patient Education 2016 [place] Diet for Gallbladder Conditions A low-fat diet can be helpful if you have pancreatitis or a gallbladder condition. With these conditions, your pancreas and gallbladder have trouble digesting fats. A healthy eating plan with less fat will help rest your pancreas and gallbladder and reduce your symptoms. WHAT DO I NEED TO KNOW ABOUT THIS DIET? Eat a low-fat diet. Reduce your fat intake to less than 20-30% of your total daily calories. This is less than 50-60 g of fat per day. Remember that you need some fat in your diet. Ask your dietician what your daily goal should be. Choose nonfat and low-fat healthy foods. Look for the words "nonfat," "low fat," or "fat free." As a guide, look on the label and  choose foods with less than 3 g of fat per serving. Eat only one serving. Avoid alcohol. Do not smoke. If you need help quitting, talk with your health care provider. Eat small frequent meals instead of three large heavy meals. WHAT FOODS CAN I EAT? Grains Include healthy grains and starches such as potatoes, wheat bread, fiber-rich cereal, and brown rice. Choose whole grain options whenever possible. In adults, whole grains should account for 45-65% of your daily calories.  Fruits and Vegetables Eat plenty of fruits and vegetables. Fresh fruits and vegetables add fiber to your diet. Meats and Other Protein Sources Eat lean meat such as chicken and pork. Trim any fat off of meat before cooking it. Eggs, fish, and beans are other sources of protein. In adults, these foods should account for 10-35% of your daily calories. Dairy Choose low-fat milk and dairy options. Dairy includes fat and protein, as well as calcium.  Fats and Oils Limit high-fat foods such as fried foods, sweets, baked goods, sugary drinks.  Other Creamy sauces and condiments, such as mayonnaise, can add extra fat. Think about whether or not you need to use them, or use smaller amounts or low fat options. WHAT FOODS ARE NOT RECOMMENDED? High fat foods, such as: [employer]. Ice cream. [place] toast. Sweet rolls. Pizza. Cheese bread. Foods covered with batter, butter, creamy  sauces, or cheese. Fried foods. Sugary drinks and desserts. Foods that cause gas or bloating   This information is not intended to replace advice given to you by your health care provider. Make sure you discuss any questions you have with your health care provider.   Document Released: 08/19/2013 Document Reviewed: 08/19/2013 Elsevier Interactive Patient Education [employer].

## 2021-08-15 NOTE — Telephone Encounter (Signed)
Patient has been advised of Pre-Admission date/time, COVID Testing date and Surgery date.  Surgery Date: 08/19/21 Preadmission Testing Date: 08/17/21 (phone 7:30 am -1:00pm ) Covid Testing Date: Not needed.   Patient has been made aware to call [phone], between 1-3:00pm the day before surgery, to find out what time to arrive for surgery.

## 2021-08-15 NOTE — H&P (View-Only) (Signed)
Outpatient Surgical Follow Up  08/15/2021  Carmen Logan is an 50 y.o. female.   Chief Complaint  Patient presents with   Follow-up    HPI: Carmen Logan is well-known to me with a history of both hiatal hernia reflux and cholelithiasis.  She did have a CT scan that have personally reviewed as well as a barium swallow. There is evidence of gallstones.  There is no evidence of a significant paraesophageal hernia.  Barium swallow also is normal. Ultrasound also showed normal common bile duct. She continues to have intermittent epigastric and right upper quadrant pain that usually is postprandial, about 30 minutes.  She experiences some burping and increased gas.  No fevers no chills.  She does have intermittent constipation and loose stool but seems to be more on the constipated side. CMP is completely normal  Past Medical History:  Diagnosis Date   Anxiety    Cervical dysplasia    Depression    Dyspareunia, female    Endometriosis    Facial skin lesion    HPV test positive    Migraine    "seems weather related"   Surgical menopause    Wears contact lenses    sometimes    Past Surgical History:  Procedure Laterality Date   BIOPSY N/A 11/16/2020   Procedure: BIOPSY;  Surgeon: Virgel Manifold, MD;  Location: [place];  Service: Endoscopy;  Laterality: N/A;   CERVICAL DISCECTOMY  03/17/2014   w/ fusion and plating   COLONOSCOPY WITH PROPOFOL N/A 11/16/2020   Procedure: COLONOSCOPY WITH PROPOFOL;  Surgeon: Virgel Manifold, MD;  Location: [place];  Service: Endoscopy;  Laterality: N/A;   CRYOTHERAPY     cervix   ESOPHAGOGASTRODUODENOSCOPY N/A 11/16/2020   Procedure: ESOPHAGOGASTRODUODENOSCOPY (EGD);  Surgeon: Virgel Manifold, MD;  Location: [place];  Service: Endoscopy;  Laterality: N/A;   VAGINAL HYSTERECTOMY  10/2009   Total vaginal hysterectomy, lso    Family History  Problem Relation Age of Onset   Heart disease Mother     Hypertension Mother    Thyroid disease Mother    Heart disease Father    Heart attack Father 78   Diabetes Maternal Aunt    Diabetes Maternal Grandmother    Depression Sister    Cancer Neg Hx    Breast cancer Neg Hx     Social History:  reports that she quit smoking about 7 years ago. Her smoking use included cigarettes. She has never used smokeless tobacco. She reports current alcohol use. She reports that she does not use drugs.  Allergies: No Known Allergies  Medications reviewed.    ROS Full ROS performed and is otherwise negative other than what is stated in HPI   BP 112/75    Pulse 83    Temp 98 F (36.7 C)    Ht 5' 2"  (1.575 m)    Wt 94 lb (42.6 kg)    SpO2 99%    BMI 17.19 kg/m   Physical Exam Vitals and nursing note reviewed. Exam conducted with a chaperone present.  Constitutional:      General: She is not in acute distress.    Appearance: Normal appearance.  HENT:     Head: Normocephalic.  Eyes:     General:        Right eye: No discharge.        Left eye: No discharge.  Cardiovascular:     Rate and Rhythm: Normal rate and regular rhythm.  Heart sounds: No murmur heard. Pulmonary:     Effort: Pulmonary effort is normal. No respiratory distress.     Breath sounds: Normal breath sounds. No stridor. No wheezing, rhonchi or rales.  Abdominal:     General: Abdomen is flat. There is no distension.     Palpations: Abdomen is soft. There is no mass.     Tenderness: There is no abdominal tenderness. There is no guarding or rebound.     Hernia: No hernia is present.  Musculoskeletal:        General: No swelling or tenderness. Normal range of motion.     Cervical back: Normal range of motion and neck supple. No rigidity.  Lymphadenopathy:     Cervical: No cervical adenopathy.  Skin:    General: Skin is warm and dry.     Capillary Refill: Capillary refill takes less than 2 seconds.  Neurological:     General: No focal deficit present.     Mental Status:  She is alert and oriented to person, place, and time.  Psychiatric:        Mood and Affect: Mood normal.        Behavior: Behavior normal.        Thought Content: Thought content normal.        Judgment: Judgment normal.      Assessment/Plan: 50 year old female with significant reflux as well as a small hiatal hernia and evidence of cholelithiasis I do think that she does have evidence of some IBS and functional GI issue.  In addition to this she has symptomatic cholelithiasis.   In addition to diet she has IBS that is not helping things. Cussed with patient detail about the situation.  His symptoms are more in line with symptomatic cholelithiasis.  I do think first step that it would be very helpful will be cholecystectomy. Cussed with the patient in detail about the rationale for cholecystectomy and she is in agreement. I discussed the procedure in detail.  The patient was given Neurosurgeon.  We discussed the risks and benefits of a laparoscopic cholecystectomy and possible cholangiogram including, but not limited to bleeding, infection, injury to surrounding structures such as the intestine or liver, bile leak, retained gallstones, need to convert to an open procedure, prolonged diarrhea, blood clots such as  DVT, common bile duct injury, anesthesia risks, and possible need for additional procedures.  The likelihood of improvement in symptoms and return to the patient's normal status is good. We discussed the typical post-operative recovery course.  I Do think that she will be a good candidate for robotic approach  Is note that I spent more than 40 minutes's encounter including personally reviewing imaging studies, coordination of her care, counseling and documenting appropriately in the chart   Caroleen Hamman, MD [place] Surgeon

## 2021-08-15 NOTE — Progress Notes (Signed)
Outpatient Surgical Follow Up  08/15/2021  Carmen Logan is an 50 y.o. female.   Chief Complaint  Patient presents with   Follow-up    HPI: Carmen Logan is well-known to me with a history of both hiatal hernia reflux and cholelithiasis.  She did have a CT scan that have personally reviewed as well as a barium swallow. There is evidence of gallstones.  There is no evidence of a significant paraesophageal hernia.  Barium swallow also is normal. Ultrasound also showed normal common bile duct. She continues to have intermittent epigastric and right upper quadrant pain that usually is postprandial, about 30 minutes.  She experiences some burping and increased gas.  No fevers no chills.  She does have intermittent constipation and loose stool but seems to be more on the constipated side. CMP is completely normal  Past Medical History:  Diagnosis Date   Anxiety    Cervical dysplasia    Depression    Dyspareunia, female    Endometriosis    Facial skin lesion    HPV test positive    Migraine    "seems weather related"   Surgical menopause    Wears contact lenses    sometimes    Past Surgical History:  Procedure Laterality Date   BIOPSY N/A 11/16/2020   Procedure: BIOPSY;  Surgeon: Virgel Manifold, MD;  Location: [facility];  Service: Endoscopy;  Laterality: N/A;   CERVICAL DISCECTOMY  03/17/2014   w/ fusion and plating   COLONOSCOPY WITH PROPOFOL N/A 11/16/2020   Procedure: COLONOSCOPY WITH PROPOFOL;  Surgeon: Virgel Manifold, MD;  Location: [facility];  Service: Endoscopy;  Laterality: N/A;   CRYOTHERAPY     cervix   ESOPHAGOGASTRODUODENOSCOPY N/A 11/16/2020   Procedure: ESOPHAGOGASTRODUODENOSCOPY (EGD);  Surgeon: Virgel Manifold, MD;  Location: [facility];  Service: Endoscopy;  Laterality: N/A;   VAGINAL HYSTERECTOMY  10/2009   Total vaginal hysterectomy, lso    Family History  Problem Relation Age of Onset   Heart disease Mother     Hypertension Mother    Thyroid disease Mother    Heart disease Father    Heart attack Father 39   Diabetes Maternal Aunt    Diabetes Maternal Grandmother    Depression Sister    Cancer Neg Hx    Breast cancer Neg Hx     Social History:  reports that she quit smoking about 7 years ago. Her smoking use included cigarettes. She has never used smokeless tobacco. She reports current alcohol use. She reports that she does not use drugs.  Allergies: No Known Allergies  Medications reviewed.    ROS Full ROS performed and is otherwise negative other than what is stated in HPI   BP 112/75    Pulse 83    Temp 98 F (36.7 C)    Ht 5' 2"  (1.575 m)    Wt 94 lb (42.6 kg)    SpO2 99%    BMI 17.19 kg/m   Physical Exam Vitals and nursing note reviewed. Exam conducted with a chaperone present.  Constitutional:      General: She is not in acute distress.    Appearance: Normal appearance.  HENT:     Head: Normocephalic.  Eyes:     General:        Right eye: No discharge.        Left eye: No discharge.  Cardiovascular:     Rate and Rhythm: Normal rate and regular rhythm.  Heart sounds: No murmur heard. Pulmonary:     Effort: Pulmonary effort is normal. No respiratory distress.     Breath sounds: Normal breath sounds. No stridor. No wheezing, rhonchi or rales.  Abdominal:     General: Abdomen is flat. There is no distension.     Palpations: Abdomen is soft. There is no mass.     Tenderness: There is no abdominal tenderness. There is no guarding or rebound.     Hernia: No hernia is present.  Musculoskeletal:        General: No swelling or tenderness. Normal range of motion.     Cervical back: Normal range of motion and neck supple. No rigidity.  Lymphadenopathy:     Cervical: No cervical adenopathy.  Skin:    General: Skin is warm and dry.     Capillary Refill: Capillary refill takes less than 2 seconds.  Neurological:     General: No focal deficit present.     Mental Status:  She is alert and oriented to person, place, and time.  Psychiatric:        Mood and Affect: Mood normal.        Behavior: Behavior normal.        Thought Content: Thought content normal.        Judgment: Judgment normal.      Assessment/Plan: 50 year old female with significant reflux as well as a small hiatal hernia and evidence of cholelithiasis I do think that she does have evidence of some IBS and functional GI issue.  In addition to this she has symptomatic cholelithiasis.   In addition to diet she has IBS that is not helping things. Cussed with patient detail about the situation.  His symptoms are more in line with symptomatic cholelithiasis.  I do think first step that it would be very helpful will be cholecystectomy. Cussed with the patient in detail about the rationale for cholecystectomy and she is in agreement. I discussed the procedure in detail.  The patient was given Neurosurgeon.  We discussed the risks and benefits of a laparoscopic cholecystectomy and possible cholangiogram including, but not limited to bleeding, infection, injury to surrounding structures such as the intestine or liver, bile leak, retained gallstones, need to convert to an open procedure, prolonged diarrhea, blood clots such as  DVT, common bile duct injury, anesthesia risks, and possible need for additional procedures.  The likelihood of improvement in symptoms and return to the patient's normal status is good. We discussed the typical post-operative recovery course.  I Do think that she will be a good candidate for robotic approach  Is note that I spent more than 40 minutes's encounter including personally reviewing imaging studies, coordination of her care, counseling and documenting appropriately in the chart   Caroleen Hamman, MD [place] Surgeon

## 2021-08-17 ENCOUNTER — Other Ambulatory Visit: Payer: Self-pay

## 2021-08-17 ENCOUNTER — Other Ambulatory Visit
Admission: RE | Admit: 2021-08-17 | Discharge: 2021-08-17 | Disposition: A | Discharge status: Home / Self Care | Payer: Managed Care, Other (non HMO) | Source: Ambulatory Visit | Attending: Surgery | Admitting: Surgery

## 2021-08-17 NOTE — Patient Instructions (Addendum)
Your procedure is scheduled on: Friday August 19, 2021. Report to Day Surgery inside [facility] 2nd floor stop by admissions desk before getting on elevator. To find out your arrival time please call [phone] between 1PM - 3PM on Thursday August 18, 2021.  Remember: Instructions that are not followed completely may result in serious medical risk,  up to and including death, or upon the discretion of your surgeon and anesthesiologist your  surgery may need to be rescheduled.     _X__ 1. Do not eat food after midnight the night before your procedure.                 No chewing gum or hard candies. You may drink clear liquids up to 2 hours                 before you are scheduled to arrive for your surgery- DO not drink clear                 liquids within 2 hours of the start of your surgery.                 Clear Liquids include:  water, apple juice without pulp, clear Gatorade, G2 or                  Gatorade Zero (avoid Red/Purple/Blue), Black Coffee or Tea (Do not add                 anything to coffee or tea).  __X__2.  On the morning of surgery brush your teeth with toothpaste and water, you                may rinse your mouth with mouthwash if you wish.  Do not swallow any toothpaste or mouthwash.     _X__ 3.  No Alcohol for 24 hours before or after surgery.   _X__ 4.  Do Not Smoke or use e-cigarettes For 24 Hours Prior to Your Surgery.                 Do not use any chewable tobacco products for at least 6 hours prior to                 Surgery.  _X__  5.  Do not use any recreational drugs (marijuana, cocaine, heroin, ecstasy, MDMA or other)                For at least one week prior to your surgery.  Combination of these drugs with anesthesia                May have life threatening results.  __X__6.  Notify your doctor if there is any change in your medical condition      (cold, fever, infections).     Do not wear jewelry, make-up, hairpins,  clips or nail polish. Do not wear lotions, powders, or perfumes. You may wear deodorant. Do not shave 48 hours prior to surgery.  Do not bring valuables to the hospital.    [facility] is not responsible for any belongings or valuables.  Contacts, dentures or bridgework may not be worn into surgery. Leave your suitcase in the car. After surgery it may be brought to your room. For patients admitted to the hospital, discharge time is determined by your treatment team.   Patients discharged the day of surgery will not be allowed to drive home.   Make arrangements for someone  to be with you for the first 24 hours of your Same Day Discharge.   __X__ Take these medicines the morning of surgery with A SIP OF WATER:    1. busPIRone (BUSPAR) 7.5 MG  2. omeprazole (PRILOSEC) 20 MG  3. sertraline (ZOLOFT) 50 MG   4. estradiol (ESTRACE) 1 MG  5.  6.  ____ Fleet Enema (as directed)   __X__ Use Antibacterial Soap (or wipes) as directed  ____ Use Benzoyl Peroxide Gel as instructed  ____ Use inhalers on the day of surgery  ____ Stop metformin 2 days prior to surgery    ____ Take 1/2 of usual insulin dose the night before surgery. No insulin the morning          of surgery.   ____ Call your PCP, cardiologist, or Pulmonologist if taking Coumadin/Plavix/aspirin and ask when to stop before your surgery.   __X__ One Week prior to surgery- Stop Anti-inflammatories such as Ibuprofen, Aleve, Advil, Motrin, meloxicam (MOBIC), diclofenac, etodolac, ketorolac, Toradol, Daypro, piroxicam, Goody's or BC powders. OK TO USE TYLENOL IF NEEDED   __X__ Stop supplements until after surgery.    ____ Bring C-Pap to the hospital.    If you have any questions regarding your pre-procedure instructions,  Please call Pre-admit Testing at [phone]

## 2021-08-18 MED ORDER — GABAPENTIN 300 MG PO CAPS: 300.0000 mg | ORAL_CAPSULE | ORAL | Status: AC

## 2021-08-18 MED ORDER — CHLORHEXIDINE GLUCONATE CLOTH 2 % EX PADS
6.0000 | MEDICATED_PAD | Freq: Once | CUTANEOUS | Status: AC
  Administered 2021-08-19: 10:00:00 6 via TOPICAL

## 2021-08-18 MED ORDER — CEFAZOLIN SODIUM-DEXTROSE 2-4 GM/100ML-% IV SOLN
2.0000 g | INTRAVENOUS | Status: AC
  Administered 2021-08-19: 11:00:00 2 g via INTRAVENOUS

## 2021-08-18 MED ORDER — ORAL CARE MOUTH RINSE: 15.0000 mL | Freq: Once | OROMUCOSAL | Status: AC

## 2021-08-18 MED ORDER — CHLORHEXIDINE GLUCONATE CLOTH 2 % EX PADS: 6.0000 | MEDICATED_PAD | Freq: Once | CUTANEOUS | Status: DC

## 2021-08-18 MED ORDER — ACETAMINOPHEN 500 MG PO TABS: 1000.0000 mg | ORAL_TABLET | ORAL | Status: AC

## 2021-08-18 MED ORDER — INDOCYANINE GREEN 25 MG IV SOLR
2.5000 mg | Freq: Once | INTRAVENOUS | Status: AC
  Administered 2021-08-19: 10:00:00 2.5 mg via INTRAVENOUS
  Filled 2021-08-18: qty 1

## 2021-08-18 MED ORDER — LACTATED RINGERS IV SOLN: INTRAVENOUS | Status: DC

## 2021-08-18 MED ORDER — CHLORHEXIDINE GLUCONATE 0.12 % MT SOLN: 15.0000 mL | Freq: Once | OROMUCOSAL | Status: AC

## 2021-08-18 MED ORDER — CELECOXIB 200 MG PO CAPS: 200.0000 mg | ORAL_CAPSULE | ORAL | Status: AC

## 2021-08-19 ENCOUNTER — Encounter
Admission: RE | Disposition: A | Discharge status: Home / Self Care | Payer: Self-pay | Source: Home / Self Care | Attending: Surgery

## 2021-08-19 ENCOUNTER — Ambulatory Visit: Payer: Managed Care, Other (non HMO) | Admitting: Anesthesiology

## 2021-08-19 ENCOUNTER — Ambulatory Visit
Admission: RE | Admit: 2021-08-19 | Discharge: 2021-08-19 | Disposition: A | Discharge status: Home / Self Care | Payer: Managed Care, Other (non HMO) | Attending: Surgery | Admitting: Surgery

## 2021-08-19 ENCOUNTER — Encounter: Payer: Self-pay | Admitting: Surgery

## 2021-08-19 ENCOUNTER — Other Ambulatory Visit: Payer: Self-pay

## 2021-08-19 DIAGNOSIS — K219 Gastro-esophageal reflux disease without esophagitis: Secondary | ICD-10-CM | POA: Diagnosis not present

## 2021-08-19 DIAGNOSIS — Z87891 Personal history of nicotine dependence: Secondary | ICD-10-CM | POA: Diagnosis not present

## 2021-08-19 DIAGNOSIS — K8010 Calculus of gallbladder with chronic cholecystitis without obstruction: Secondary | ICD-10-CM | POA: Diagnosis not present

## 2021-08-19 DIAGNOSIS — K582 Mixed irritable bowel syndrome: Secondary | ICD-10-CM | POA: Insufficient documentation

## 2021-08-19 DIAGNOSIS — K8020 Calculus of gallbladder without cholecystitis without obstruction: Secondary | ICD-10-CM

## 2021-08-19 DIAGNOSIS — K449 Diaphragmatic hernia without obstruction or gangrene: Secondary | ICD-10-CM | POA: Insufficient documentation

## 2021-08-19 DIAGNOSIS — K8050 Calculus of bile duct without cholangitis or cholecystitis without obstruction: Secondary | ICD-10-CM | POA: Diagnosis present

## 2021-08-19 SURGERY — CHOLECYSTECTOMY, ROBOT-ASSISTED, LAPAROSCOPIC
Anesthesia: General | Site: Abdomen

## 2021-08-19 MED ORDER — FENTANYL CITRATE (PF) 100 MCG/2ML IJ SOLN
INTRAMUSCULAR | Status: DC | PRN
  Administered 2021-08-19: 25 ug via INTRAVENOUS
  Administered 2021-08-19: 50 ug via INTRAVENOUS
  Administered 2021-08-19: 25 ug via INTRAVENOUS

## 2021-08-19 MED ORDER — FENTANYL CITRATE (PF) 100 MCG/2ML IJ SOLN
25.0000 ug | INTRAMUSCULAR | Status: DC | PRN
  Administered 2021-08-19 (×4): 25 ug via INTRAVENOUS

## 2021-08-19 MED ORDER — CELECOXIB 200 MG PO CAPS
ORAL_CAPSULE | ORAL | Status: AC
  Administered 2021-08-19: 10:00:00 200 mg via ORAL
  Filled 2021-08-19: qty 1

## 2021-08-19 MED ORDER — PROPOFOL 10 MG/ML IV BOLUS
INTRAVENOUS | Status: AC
  Filled 2021-08-19: qty 20

## 2021-08-19 MED ORDER — FENTANYL CITRATE (PF) 100 MCG/2ML IJ SOLN
INTRAMUSCULAR | Status: AC
  Filled 2021-08-19: qty 2

## 2021-08-19 MED ORDER — PROPOFOL 10 MG/ML IV BOLUS
INTRAVENOUS | Status: DC | PRN
  Administered 2021-08-19: 120 mg via INTRAVENOUS

## 2021-08-19 MED ORDER — SEVOFLURANE IN SOLN
RESPIRATORY_TRACT | Status: AC
  Filled 2021-08-19: qty 250

## 2021-08-19 MED ORDER — GABAPENTIN 300 MG PO CAPS
ORAL_CAPSULE | ORAL | Status: AC
  Administered 2021-08-19: 10:00:00 300 mg via ORAL
  Filled 2021-08-19: qty 1

## 2021-08-19 MED ORDER — SUGAMMADEX SODIUM 200 MG/2ML IV SOLN
INTRAVENOUS | Status: DC | PRN
  Administered 2021-08-19: 100 mg via INTRAVENOUS

## 2021-08-19 MED ORDER — ONDANSETRON HCL 4 MG/2ML IJ SOLN: 4.0000 mg | Freq: Once | INTRAMUSCULAR | Status: DC | PRN

## 2021-08-19 MED ORDER — BUPIVACAINE LIPOSOME 1.3 % IJ SUSP
INTRAMUSCULAR | Status: AC
  Filled 2021-08-19: qty 20

## 2021-08-19 MED ORDER — ONDANSETRON HCL 4 MG/2ML IJ SOLN
INTRAMUSCULAR | Status: DC | PRN
  Administered 2021-08-19: 4 mg via INTRAVENOUS

## 2021-08-19 MED ORDER — CEFAZOLIN SODIUM-DEXTROSE 2-4 GM/100ML-% IV SOLN
INTRAVENOUS | Status: AC
  Filled 2021-08-19: qty 100

## 2021-08-19 MED ORDER — LIDOCAINE HCL (PF) 2 % IJ SOLN
INTRAMUSCULAR | Status: AC
  Filled 2021-08-19: qty 5

## 2021-08-19 MED ORDER — HYDROCODONE-ACETAMINOPHEN 5-325 MG PO TABS
ORAL_TABLET | ORAL | Status: AC
  Filled 2021-08-19: qty 1

## 2021-08-19 MED ORDER — ONDANSETRON HCL 4 MG/2ML IJ SOLN
INTRAMUSCULAR | Status: AC
  Filled 2021-08-19: qty 2

## 2021-08-19 MED ORDER — MIDAZOLAM HCL 2 MG/2ML IJ SOLN
INTRAMUSCULAR | Status: DC | PRN
  Administered 2021-08-19: 1 mg via INTRAVENOUS

## 2021-08-19 MED ORDER — ROCURONIUM BROMIDE 100 MG/10ML IV SOLN
INTRAVENOUS | Status: DC | PRN
  Administered 2021-08-19: 50 mg via INTRAVENOUS

## 2021-08-19 MED ORDER — DEXAMETHASONE SODIUM PHOSPHATE 10 MG/ML IJ SOLN
INTRAMUSCULAR | Status: AC
  Filled 2021-08-19: qty 1

## 2021-08-19 MED ORDER — GLYCOPYRROLATE 0.2 MG/ML IJ SOLN
INTRAMUSCULAR | Status: AC
  Filled 2021-08-19: qty 1

## 2021-08-19 MED ORDER — ROCURONIUM BROMIDE 10 MG/ML (PF) SYRINGE
PREFILLED_SYRINGE | INTRAVENOUS | Status: AC
  Filled 2021-08-19: qty 10

## 2021-08-19 MED ORDER — HYDROCODONE-ACETAMINOPHEN 5-325 MG PO TABS: 1.0000 | ORAL_TABLET | Freq: Four times a day (QID) | ORAL | 0 refills | Status: DC | PRN

## 2021-08-19 MED ORDER — BUPIVACAINE-EPINEPHRINE (PF) 0.25% -1:200000 IJ SOLN
INTRAMUSCULAR | Status: DC | PRN
  Administered 2021-08-19: 11:00:00 50 mL via SURGICAL_CAVITY

## 2021-08-19 MED ORDER — DEXAMETHASONE SODIUM PHOSPHATE 10 MG/ML IJ SOLN
INTRAMUSCULAR | Status: DC | PRN
  Administered 2021-08-19: 5 mg via INTRAVENOUS

## 2021-08-19 MED ORDER — MIDAZOLAM HCL 2 MG/2ML IJ SOLN
INTRAMUSCULAR | Status: AC
  Filled 2021-08-19: qty 2

## 2021-08-19 MED ORDER — HYDROCODONE-ACETAMINOPHEN 5-325 MG PO TABS
1.0000 | ORAL_TABLET | Freq: Once | ORAL | Status: AC
  Administered 2021-08-19: 12:00:00 1 via ORAL

## 2021-08-19 MED ORDER — CHLORHEXIDINE GLUCONATE 0.12 % MT SOLN
OROMUCOSAL | Status: AC
  Administered 2021-08-19: 10:00:00 15 mL via OROMUCOSAL
  Filled 2021-08-19: qty 15

## 2021-08-19 MED ORDER — ACETAMINOPHEN 500 MG PO TABS
ORAL_TABLET | ORAL | Status: AC
  Administered 2021-08-19: 10:00:00 1000 mg via ORAL
  Filled 2021-08-19: qty 2

## 2021-08-19 MED ORDER — LIDOCAINE HCL (CARDIAC) PF 100 MG/5ML IV SOSY
PREFILLED_SYRINGE | INTRAVENOUS | Status: DC | PRN
  Administered 2021-08-19: 80 mg via INTRAVENOUS

## 2021-08-19 MED ORDER — PHENYLEPHRINE HCL-NACL 20-0.9 MG/250ML-% IV SOLN
INTRAVENOUS | Status: AC
  Filled 2021-08-19: qty 250

## 2021-08-19 MED ORDER — BUPIVACAINE-EPINEPHRINE (PF) 0.25% -1:200000 IJ SOLN
INTRAMUSCULAR | Status: AC
  Filled 2021-08-19: qty 30

## 2021-08-19 SURGICAL SUPPLY — 49 items
CANNULA REDUC XI 12-8 STAPL (CANNULA) ×1
CANNULA REDUC XI 12-8MM STAPL (CANNULA) ×1
CANNULA REDUCER 12-8 DVNC XI (CANNULA) ×1 IMPLANT
CLIP LIGATING HEMO O LOK GREEN (MISCELLANEOUS) ×3 IMPLANT
DECANTER SPIKE VIAL GLASS SM (MISCELLANEOUS) ×3 IMPLANT
DERMABOND ADVANCED (GAUZE/BANDAGES/DRESSINGS) ×2
DERMABOND ADVANCED .7 DNX12 (GAUZE/BANDAGES/DRESSINGS) ×1 IMPLANT
DRAPE ARM DVNC X/XI (DISPOSABLE) ×4 IMPLANT
DRAPE COLUMN DVNC XI (DISPOSABLE) ×1 IMPLANT
DRAPE DA VINCI XI ARM (DISPOSABLE) ×8
DRAPE DA VINCI XI COLUMN (DISPOSABLE) ×2
ELECT CAUTERY BLADE 6.4 (BLADE) ×3 IMPLANT
ELECT REM PT RETURN 9FT ADLT (ELECTROSURGICAL) ×3
ELECTRODE REM PT RTRN 9FT ADLT (ELECTROSURGICAL) ×1 IMPLANT
GAUZE 4X4 16PLY ~~LOC~~+RFID DBL (SPONGE) ×3 IMPLANT
GLOVE SURG ENC MOIS LTX SZ7 (GLOVE) ×6 IMPLANT
GOWN STRL REUS W/ TWL LRG LVL3 (GOWN DISPOSABLE) ×4 IMPLANT
GOWN STRL REUS W/TWL LRG LVL3 (GOWN DISPOSABLE) ×8
IRRIGATION STRYKERFLOW (MISCELLANEOUS) IMPLANT
IRRIGATOR STRYKERFLOW (MISCELLANEOUS)
IV NS 1000ML (IV SOLUTION)
IV NS 1000ML BAXH (IV SOLUTION) IMPLANT
KIT PINK PAD W/HEAD ARE REST (MISCELLANEOUS) ×3 IMPLANT
KIT PINK PAD W/HEAD ARM REST (MISCELLANEOUS) ×1 IMPLANT
LABEL OR SOLS (LABEL) ×3 IMPLANT
MANIFOLD NEPTUNE II (INSTRUMENTS) ×3 IMPLANT
NEEDLE HYPO 22GX1.5 SAFETY (NEEDLE) ×3 IMPLANT
NS IRRIG 500ML POUR BTL (IV SOLUTION) ×3 IMPLANT
OBTURATOR OPTICAL STANDARD 8MM (TROCAR) ×2
OBTURATOR OPTICAL STND 8 DVNC (TROCAR) ×1
OBTURATOR OPTICALSTD 8 DVNC (TROCAR) ×1 IMPLANT
PACK LAP CHOLECYSTECTOMY (MISCELLANEOUS) ×3 IMPLANT
PENCIL ELECTRO HAND CTR (MISCELLANEOUS) ×3 IMPLANT
POUCH SPECIMEN RETRIEVAL 10MM (ENDOMECHANICALS) ×3 IMPLANT
SEAL CANN UNIV 5-8 DVNC XI (MISCELLANEOUS) ×3 IMPLANT
SEAL XI 5MM-8MM UNIVERSAL (MISCELLANEOUS) ×6
SET TUBE SMOKE EVAC HIGH FLOW (TUBING) ×3 IMPLANT
SOLUTION ELECTROLUBE (MISCELLANEOUS) ×3 IMPLANT
SPONGE T-LAP 18X18 ~~LOC~~+RFID (SPONGE) ×3 IMPLANT
SPONGE T-LAP 4X18 ~~LOC~~+RFID (SPONGE) ×2 IMPLANT
STAPLER CANNULA SEAL DVNC XI (STAPLE) ×1 IMPLANT
STAPLER CANNULA SEAL XI (STAPLE) ×2
SUT MNCRL AB 4-0 PS2 18 (SUTURE) ×3 IMPLANT
SUT VICRYL 0 AB UR-6 (SUTURE) ×6 IMPLANT
SYR 20ML LL LF (SYRINGE) ×3 IMPLANT
SYR 30ML LL (SYRINGE) ×3 IMPLANT
TAPE TRANSPORE STRL 2 31045 (GAUZE/BANDAGES/DRESSINGS) ×3 IMPLANT
TROCAR BALLN GELPORT 12X130M (ENDOMECHANICALS) ×3 IMPLANT
WATER STERILE IRR 500ML POUR (IV SOLUTION) ×3 IMPLANT

## 2021-08-19 NOTE — Anesthesia Preprocedure Evaluation (Addendum)
Anesthesia Evaluation  Patient identified by MRN, date of birth, ID band Patient awake    Reviewed: Allergy & Precautions, NPO status , Patient's Chart, lab work & pertinent test results  Airway Mallampati: II  TM Distance: >3 FB Neck ROM: full    Dental  (+) Teeth Intact   Pulmonary neg pulmonary ROS, former smoker,    Pulmonary exam normal breath sounds clear to auscultation       Cardiovascular Exercise Tolerance: Good negative cardio ROS Normal cardiovascular exam Rhythm:Regular Rate:Normal     Neuro/Psych  Headaches, Anxiety Depression negative neurological ROS  negative psych ROS   GI/Hepatic negative GI ROS, Neg liver ROS,   Endo/Other  negative endocrine ROS  Renal/GU negative Renal ROS  negative genitourinary   Musculoskeletal negative musculoskeletal ROS (+)   Abdominal Normal abdominal exam  (+)   Peds negative pediatric ROS (+)  Hematology negative hematology ROS (+)   Anesthesia Other Findings Past Medical History: No date: Anxiety No date: Cervical dysplasia No date: Depression No date: Dyspareunia, female No date: Endometriosis No date: Facial skin lesion No date: HPV test positive No date: Migraine     Comment:  "seems weather related" No date: Surgical menopause No date: Wears contact lenses     Comment:  sometimes  Past Surgical History: 11/16/2020: BIOPSY; N/A     Comment:  Procedure: BIOPSY;  Surgeon: Virgel Manifold, MD;                Location: [facility];  Service: Endoscopy;                Laterality: N/A; 03/17/2014: CERVICAL DISCECTOMY     Comment:  w/ fusion and plating 11/16/2020: COLONOSCOPY WITH PROPOFOL; N/A     Comment:  Procedure: COLONOSCOPY WITH PROPOFOL;  Surgeon:               Virgel Manifold, MD;  Location: [facility];              Service: Endoscopy;  Laterality: N/A; No date: CRYOTHERAPY     Comment:  cervix 11/16/2020:  ESOPHAGOGASTRODUODENOSCOPY; N/A     Comment:  Procedure: ESOPHAGOGASTRODUODENOSCOPY (EGD);  Surgeon:               Virgel Manifold, MD;  Location: [facility];              Service: Endoscopy;  Laterality: N/A; 1999: TUBAL LIGATION 10/2009: VAGINAL HYSTERECTOMY     Comment:  Total vaginal hysterectomy, lso  BMI    Body Mass Index: 17.18 kg/m      Reproductive/Obstetrics negative OB ROS                            Anesthesia Physical Anesthesia Plan  ASA: 2  Anesthesia Plan: General   Post-op Pain Management:    Induction: Intravenous  PONV Risk Score and Plan: 1 and Ondansetron  Airway Management Planned: Oral ETT  Additional Equipment:   Intra-op Plan:   Post-operative Plan: Extubation in OR  Informed Consent: I have reviewed the patients History and Physical, chart, labs and discussed the procedure including the risks, benefits and alternatives for the proposed anesthesia with the patient or authorized representative who has indicated his/her understanding and acceptance.     Dental Advisory Given  Plan Discussed with: CRNA and Surgeon  Anesthesia Plan Comments:         Anesthesia Quick Evaluation

## 2021-08-19 NOTE — Discharge Instructions (Addendum)
Laparoscopic Cholecystectomy, Care After   These instructions give you information on caring for yourself after your procedure. Your doctor may also give you more specific instructions. Call your doctor if you have any problems or questions after your procedure.  HOME CARE  Change your bandages (dressings) as told by your doctor.  Keep the wound dry and clean. Wash the wound gently with soap and water. Pat the wound dry with a clean towel.  Do not take baths, swim, or use hot tubs for 2 weeks, or as told by your doctor.  Only take medicine as told by your doctor.  Eat a normal diet as told by your doctor.  Do not lift anything heavier than 10 pounds (4.5 kg) until your doctor says it is okay.  Do not play contact sports for 1 week, or as told by your doctor. GET HELP IF:  Your wound is red, puffy (swollen), or painful.  You have yellowish-white fluid (pus) coming from the wound.  You have fluid draining from the wound for more than 1 day.  You have a bad smell coming from the wound.  Your wound breaks open. GET HELP RIGHT AWAY IF:  You have trouble breathing.  You have chest pain.  You have a fever >101  You have pain in the shoulders (shoulder strap areas) that is getting worse.  You feel dizzy or pass out (faint).  You have severe belly (abdominal) pain.  You feel sick to your stomach (nauseous) or throw up (vomit) for more than 1 day.  AMBULATORY SURGERY  DISCHARGE INSTRUCTIONS   The drugs that you were given will stay in your system until tomorrow so for the next 24 hours you should not:  Drive an automobile Make any legal decisions Drink any alcoholic beverage   You may resume regular meals tomorrow.  Today it is better to start with liquids and gradually work up to solid foods.  You may eat anything you prefer, but it is better to start with liquids, then soup and crackers, and gradually work up to solid foods.   Please notify your doctor immediately if you have any  unusual bleeding, trouble breathing, redness and pain at the surgery site, drainage, fever, or pain not relieved by medication.    Additional Instructions: Please contact your physician with any problems or Same Day Surgery at [phone], Monday through Friday 6 am to 4 pm, or [place] at [facility] number at [phone].

## 2021-08-19 NOTE — Interval H&P Note (Signed)
History and Physical Interval Note:  08/19/2021 9:39 AM  Carmen Logan  has presented today for surgery, with the diagnosis of biliary colie.  The various methods of treatment have been discussed with the patient and family. After consideration of risks, benefits and other options for treatment, the patient has consented to  Procedure(s): XI ROBOTIC ASSISTED LAPAROSCOPIC CHOLECYSTECTOMY (N/A) [place] (ICG) (N/A) as a surgical intervention.  The patient's history has been reviewed, patient examined, no change in status, stable for surgery.  I have reviewed the patient's chart and labs.  Questions were answered to the patient's satisfaction.     [place]

## 2021-08-19 NOTE — Transfer of Care (Signed)
Immediate Anesthesia Transfer of Care Note  Patient: Carmen Logan  Procedure(s) Performed: XI ROBOTIC ASSISTED LAPAROSCOPIC CHOLECYSTECTOMY INDOCYANINE GREEN FLUORESCENCE IMAGING (ICG)  Patient Location: PACU  Anesthesia Type:General  Level of Consciousness: awake, alert  and oriented  Airway & Oxygen Therapy: Patient Spontanous Breathing and Patient connected to nasal cannula oxygen  Post-op Assessment: Report given to RN and Post -op Vital signs reviewed and stable  Post vital signs: Reviewed and stable  Last Vitals:  Vitals Value Taken Time  BP 158/86 08/19/21 1138  Temp 36.6 C 08/19/21 1138  Pulse 87 08/19/21 1138  Resp 18 08/19/21 1138  SpO2 100 % 08/19/21 1138    Last Pain:  Vitals:   08/19/21 0954  TempSrc: Temporal  PainSc: 0-No pain         Complications: No notable events documented.

## 2021-08-19 NOTE — Anesthesia Postprocedure Evaluation (Signed)
Anesthesia Post Note  Patient: Carmen Logan  Procedure(s) Performed: XI ROBOTIC ASSISTED LAPAROSCOPIC CHOLECYSTECTOMY (Abdomen) INDOCYANINE GREEN FLUORESCENCE IMAGING (ICG) (Abdomen)  Patient location during evaluation: PACU Anesthesia Type: General Level of consciousness: awake and awake and alert Pain management: pain level controlled Vital Signs Assessment: post-procedure vital signs reviewed and stable Cardiovascular status: stable Anesthetic complications: no   No notable events documented.   Last Vitals:  Vitals:   08/19/21 1230 08/19/21 1250  BP: (!) 144/99 (!) 158/85  Pulse: 86 77  Resp: 17 15  Temp:  36.6 C  SpO2: 97% 96%    Last Pain:  Vitals:   08/19/21 1250  TempSrc: Temporal  PainSc: 5                  VAN STAVEREN,Euclide Granito

## 2021-08-19 NOTE — Op Note (Signed)
Robotic assisted laparoscopic Cholecystectomy  Pre-operative Diagnosis: Biliary colic  Post-operative Diagnosis: same  Procedure:  Robotic assisted laparoscopic Cholecystectomy  Surgeon: Caroleen Hamman, MD FACS  Anesthesia: Gen. with endotracheal tube  Findings: Chronic mild Cholecystitis w Gallstones  Estimated Blood Loss: 5 cc       Specimens: Gallbladder           Complications: none   Procedure Details  The patient was seen again in the Holding Room. The benefits, complications, treatment options, and expected outcomes were discussed with the patient. The risks of bleeding, infection, recurrence of symptoms, failure to resolve symptoms, bile duct damage, bile duct leak, retained common bile duct stone, bowel injury, any of which could require further surgery and/or ERCP, stent, or papillotomy were reviewed with the patient. The likelihood of improving the patient's symptoms with return to their baseline status is good.  The patient and/or family concurred with the proposed plan, giving informed consent.  The patient was taken to Operating Room, identified  and the procedure verified as Laparoscopic Cholecystectomy.  A Time Out was held and the above information confirmed.  Prior to the induction of general anesthesia, antibiotic prophylaxis was administered. VTE prophylaxis was in place. General endotracheal anesthesia was then administered and tolerated well. After the induction, the abdomen was prepped with Chloraprep and draped in the sterile fashion. The patient was positioned in the supine position.  Cut down technique was used to enter the abdominal cavity and a Hasson trochar was placed after two vicryl stitches were anchored to the fascia. Pneumoperitoneum was then created with CO2 and tolerated well without any adverse changes in the patient's vital signs.  Three 8-mm ports were placed under direct vision. All skin incisions  were infiltrated with a local anesthetic agent  before making the incision and placing the trocars.   The patient was positioned  in reverse Trendelenburg, robot was brought to the surgical field and docked in the standard fashion.  We made sure all the instrumentation was kept indirect view at all times and that there were no collision between the arms. I scrubbed out and went to the console.  The gallbladder was identified, the fundus grasped and retracted cephalad. Adhesions were lysed bluntly. The infundibulum was grasped and retracted laterally, exposing the peritoneum overlying the triangle of Calot. This was then divided and exposed in a blunt fashion. An extended critical view of the cystic duct and cystic artery was obtained.  The cystic duct was clearly identified and bluntly dissected.   Artery and duct were double clipped and divided. Using ICG cholangiography we visualize the cystic duct and CBD , no evidence of bile injuries observed. The gallbladder was taken from the gallbladder fossa in a retrograde fashion with the electrocautery.  Hemostasis was achieved with the electrocautery. nspection of the right upper quadrant was performed. No bleeding, bile duct injury or leak, or bowel injury was noted. Robotic instruments and robotic arms were undocked in the standard fashion.  I scrubbed back in.  The gallbladder was removed and placed in an Endocatch bag.   Pneumoperitoneum was released.  The periumbilical port site was closed with interrumpted 0 Vicryl sutures. 4-0 subcuticular Monocryl was used to close the skin. Dermabond was  applied.  The patient was then extubated and brought to the recovery room in stable condition. Sponge, lap, and needle counts were correct at closure and at the conclusion of the case.  Caroleen Hamman, MD, FACS

## 2021-08-19 NOTE — Anesthesia Procedure Notes (Signed)
Procedure Name: Intubation Date/Time: 08/19/2021 10:27 AM Performed by: Johnna Acosta, CRNA Pre-anesthesia Checklist: Patient identified, Emergency Drugs available, Suction available, Patient being monitored and Timeout performed Patient Re-evaluated:Patient Re-evaluated prior to induction Oxygen Delivery Method: Circle system utilized Preoxygenation: Pre-oxygenation with 100% oxygen Induction Type: IV induction Laryngoscope Size: McGraph and 3 Grade View: Grade I Tube type: Oral Tube size: 6.5 mm Number of attempts: 1 Airway Equipment and Method: Stylet and Video-laryngoscopy Placement Confirmation: ETT inserted through vocal cords under direct vision, positive ETCO2 and breath sounds checked- equal and bilateral Secured at: 20 cm Tube secured with: Tape Dental Injury: Teeth and Oropharynx as per pre-operative assessment

## 2021-08-23 LAB — SURGICAL PATHOLOGY

## 2021-09-12 ENCOUNTER — Encounter: Payer: Self-pay | Admitting: Surgery

## 2021-09-12 ENCOUNTER — Ambulatory Visit (INDEPENDENT_AMBULATORY_CARE_PROVIDER_SITE_OTHER): Payer: Managed Care, Other (non HMO) | Admitting: Surgery

## 2021-09-12 ENCOUNTER — Other Ambulatory Visit: Payer: Self-pay

## 2021-09-12 VITALS — BP 103/65 | HR 63 | Temp 98.0°F | Ht 62.0 in | Wt 101.6 lb

## 2021-09-12 DIAGNOSIS — K8010 Calculus of gallbladder with chronic cholecystitis without obstruction: Secondary | ICD-10-CM

## 2021-09-12 DIAGNOSIS — Z09 Encounter for follow-up examination after completed treatment for conditions other than malignant neoplasm: Secondary | ICD-10-CM

## 2021-09-12 NOTE — Progress Notes (Signed)
Carmen Logan is 3 weeks out from robotic cholecystectomy.  She is doing very well.  Her operative pain has pretty much resolved.  She is able to eat and she is gaining weight.  Discussed with her  PE NAD Abdomen soft incisions healing well without infection.  A/P doing very well after cholecystectomy.  RTC as needed

## 2021-09-12 NOTE — Patient Instructions (Signed)
Please call if you have any questions or concerns.

## 2021-10-09 ENCOUNTER — Other Ambulatory Visit: Payer: Self-pay | Admitting: Family Medicine

## 2021-10-09 DIAGNOSIS — F331 Major depressive disorder, recurrent, moderate: Secondary | ICD-10-CM

## 2021-10-10 NOTE — Telephone Encounter (Signed)
Requested medication (s) are due for refill today: Yes  Requested medication (s) are on the active medication list: Yes  Last refill:  10/19/20  Future visit scheduled: No  Notes to clinic:  Unable to refill per protocol, cannot delegate.      Requested Prescriptions  Pending Prescriptions Disp Refills   sertraline (ZOLOFT) 50 MG tablet [Pharmacy Med Name: SERTRALINE 50MG TABLETS] 135 tablet 3    Sig: TAKE 1 AND 1/2 TABLETS(75 MG) BY MOUTH DAILY     Not Delegated - Psychiatry:  Antidepressants - SSRI - sertraline Failed - 10/09/2021  7:10 AM      Failed - This refill cannot be delegated      Failed - Valid encounter within last 6 months    Recent Outpatient Visits           7 months ago Annual physical exam   [facility] Olin Hauser, DO   11 months ago Unintentional weight loss   [facility] Olin Hauser, DO   1 year ago Vasovagal syncope   [place], DO   1 year ago Annual physical exam   [place], DO   2 years ago Recurrent depressive disorder, current episode moderate ([place])   [facility], Devonne Doughty, DO       Future Appointments             In 10 months Philip Aspen, CNM [facility] - AST in normal range and within 360 days    AST  Date Value Ref Range Status  03/15/2021 10 0 - 40 IU/L Final          Passed - ALT in normal range and within 360 days    ALT  Date Value Ref Range Status  03/15/2021 8 0 - 32 IU/L Final          Passed - Completed PHQ-2 or PHQ-9 in the last 360 days

## 2021-12-12 ENCOUNTER — Other Ambulatory Visit: Payer: Self-pay | Admitting: Family Medicine

## 2021-12-12 DIAGNOSIS — G43109 Migraine with aura, not intractable, without status migrainosus: Secondary | ICD-10-CM

## 2021-12-12 NOTE — Telephone Encounter (Signed)
Requested medication (s) are due for refill today: yes ? ?Requested medication (s) are on the active medication list: yes ? ?Last refill:  06/28/21 ? ?Future visit scheduled: no ? ?Notes to clinic:  Unable to refill per protocol, cannot delegate. ? ? ? ?  ?Requested Prescriptions  ?Pending Prescriptions Disp Refills  ? butalbital-apap-caffeine-codeine (FIORICET WITH CODEINE) 50-325-40-30 MG capsule [Pharmacy Med Name: BUT/ACETA/CAFF/COD CAP 50-325-40-30] 30 capsule   ?  Sig: TAKE 1 TO 2 CAPSULES BY MOUTH EVERY 4 HOURS AS NEEDED FOR HEADACHE OR MIGRAINE. NO MORE THAN 6 CAPSULES DAILY  ?  ? Not Delegated - Analgesics:  Opioid Agonist Combinations 2 Failed - 12/12/2021  4:22 AM  ?  ?  Failed - This refill cannot be delegated  ?  ?  Failed - Urine Drug Screen completed in last 360 days  ?  ?  Failed - Valid encounter within last 3 months  ?  Recent Outpatient Visits   ? ?      ? 9 months ago Annual physical exam  ? [place], DO  ? 1 year ago Unintentional weight loss  ? [place], DO  ? 1 year ago Vasovagal syncope  ? [place], DO  ? 1 year ago Annual physical exam  ? [place], DO  ? 2 years ago Recurrent depressive disorder, current episode moderate ([place])  ? [place], DO  ? ?  ?  ?Future Appointments   ? ?        ? In 8 months Philip Aspen, CNM [facility]  ? ?  ? ? ?  ?  ?  Passed - Cr in normal range and within 360 days  ?  Creatinine  ?Date Value Ref Range Status  ?11/08/2011 0.78 0.60 - 1.30 mg/dL Final  ? ?Creatinine, Ser  ?Date Value Ref Range Status  ?03/15/2021 0.94 0.57 - 1.00 mg/dL Final  ?  ?  ?  ?  Passed - eGFR is 10 or above and within 360 days  ?  EGFR (African American)  ?Date Value Ref Range Status  ?11/08/2011 >60 >16m/min Final  ? ?GFR calc Af AWyvonnia Lora ?Date Value Ref Range  Status  ?10/20/2020 97 >59 mL/min/1.73 Final  ?  Comment:  ?  **In accordance with recommendations from the NKF-ASN Task force,** ?  [facility] is in the process of updating its eGFR calculation to the ?  2021 CKD-EPI creatinine equation that estimates kidney function ?  without a race variable. ?  ? ?EGFR (Non-African Amer.)  ?Date Value Ref Range Status  ?11/08/2011 >60 >642mmin Final  ?  Comment:  ?  eGFR values <6063min/1.73 m2 may be an indication of chronic ?kidney disease (CKD). ?Calculated eGFR, using the MRDR Study equation, is useful in  ?patients with stable renal function. ?The eGFR calculation will not be reliable in acutely ill patients ?when serum creatinine is changing rapidly. It is not useful in ?patients on dialysis. The eGFR calculation may not be applicable ?to patients at the low and high extremes of body sizes, pregnant ?women, and vegetarians. ?  ? ?GFR calc non Af Amer  ?Date Value Ref Range Status  ?10/20/2020 84 >59 mL/min/1.73 Final  ? ?eGFR  ?Date Value Ref Range Status  ?03/15/2021 75 >59 mL/min/1.73 Final  ?  ?  ?  ?  Passed - Patient  is not pregnant  ?  ?  ? ? ?

## 2022-01-07 ENCOUNTER — Other Ambulatory Visit: Payer: Self-pay | Admitting: Family Medicine

## 2022-01-07 DIAGNOSIS — F3340 Major depressive disorder, recurrent, in remission, unspecified: Secondary | ICD-10-CM

## 2022-01-09 NOTE — Telephone Encounter (Signed)
Requested medication (s) are due for refill today: yes ? ?Requested medication (s) are on the active medication list: yes ? ?Last refill:  06/04/21 #90/1 ? ?Future visit scheduled: no ? ?Notes to clinic:  pt is due for an appt. Called and LVMTCB ? ? ?  ?Requested Prescriptions  ?Pending Prescriptions Disp Refills  ? traZODone (DESYREL) 50 MG tablet [Pharmacy Med Name: TRAZODONE 50MG TABLETS] 90 tablet 1  ?  Sig: TAKE 1 TABLET(50 MG) BY MOUTH AT BEDTIME  ?  ? Psychiatry: Antidepressants - Serotonin Modulator Failed - 01/07/2022  7:04 AM  ?  ?  Failed - Valid encounter within last 6 months  ?  Recent Outpatient Visits   ? ?      ? 10 months ago Annual physical exam  ? [place], DO  ? 1 year ago Unintentional weight loss  ? [place], DO  ? 1 year ago Vasovagal syncope  ? [place], DO  ? 1 year ago Annual physical exam  ? [place], DO  ? 2 years ago Recurrent depressive disorder, current episode moderate ([place])  ? [place], DO  ? ?  ?  ?Future Appointments   ? ?        ? In 7 months Philip Aspen, CNM [facility]  ? ?  ? ? ?  ?  ?  Passed - Completed PHQ-2 or PHQ-9 in the last 360 days  ?  ?  ? ?

## 2022-01-09 NOTE — Telephone Encounter (Signed)
Patient called, left VM to return the call to the office to scheduled an appt for medication refill request.   

## 2022-03-20 IMAGING — CT CT ABDOMEN W/ CM
3 of 5 series · 14 of 46 positions shown, 16 images · IV contrast (omnipaque)
Comparison: None.

CLINICAL DATA: Hiatal hernia, constipation, diarrhea, weight loss.

EXAM:
CT CHEST AND ABDOMEN WITH CONTRAST
TECHNIQUE: Multidetector CT imaging of the chest and abdomen was performed
following the standard protocol during bolus administration of
intravenous contrast.
CONTRAST:  100mL OMNIPAQUE IOHEXOL 300 MG/ML  SOLN

[Series 2: cap with · axial · 0.56mm/px · z∈[-465,-120]mm · 9 of 87 slices shown, 11 images]
[im 9/87  soft-tissue]
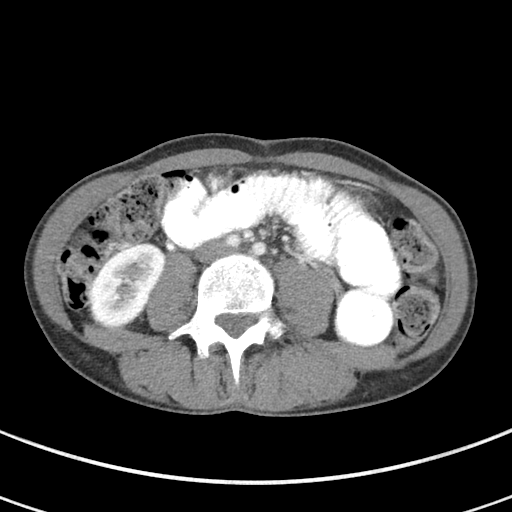
[im 9/87  bone]
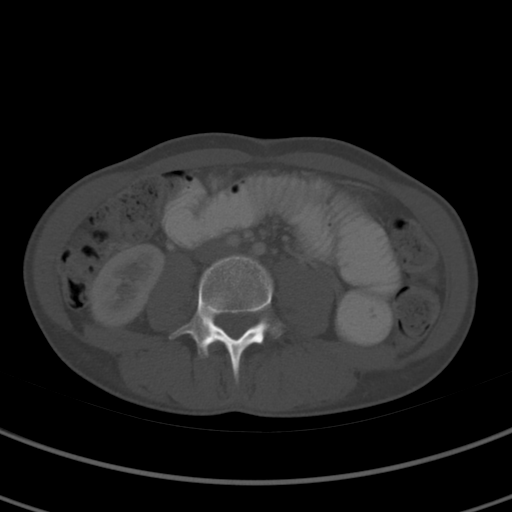
[im 18/87  soft-tissue]
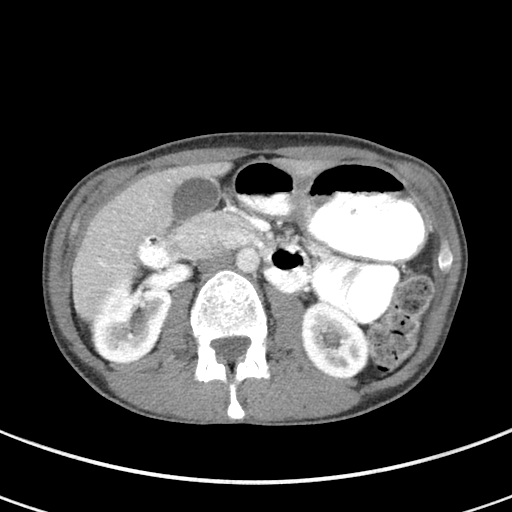
[im 26/87  soft-tissue]
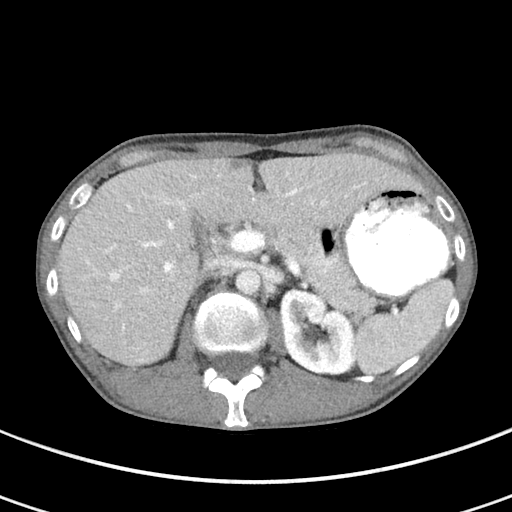
[im 35/87  soft-tissue]
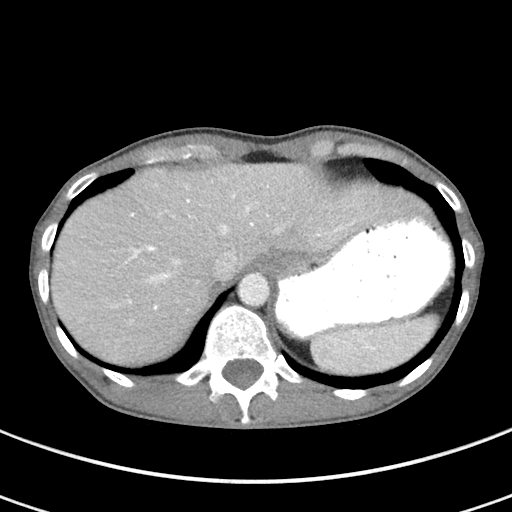
[im 44/87  soft-tissue]
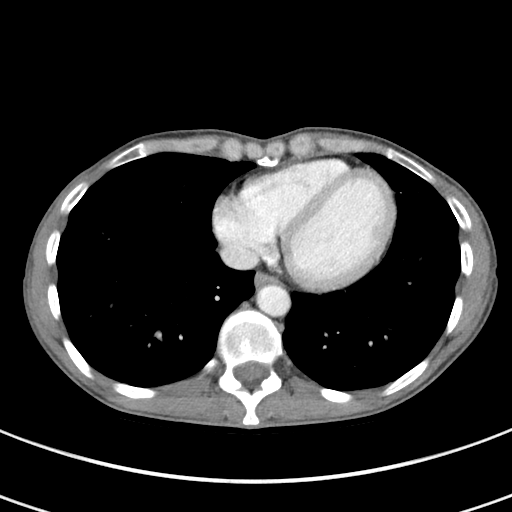
[im 52/87  soft-tissue]
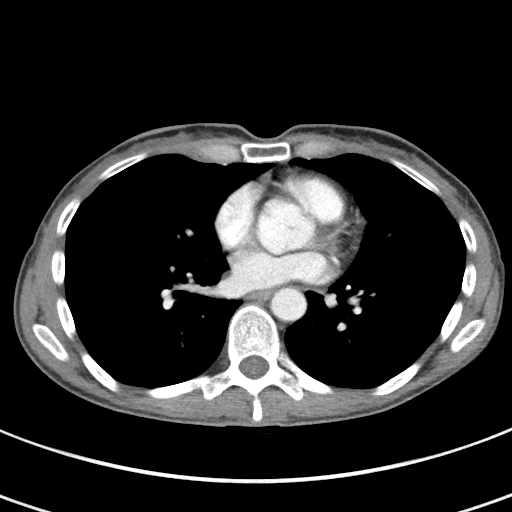
[im 61/87  soft-tissue]
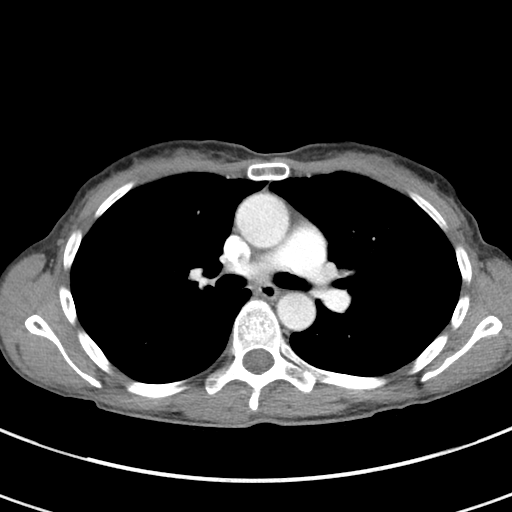
[im 69/87  soft-tissue]
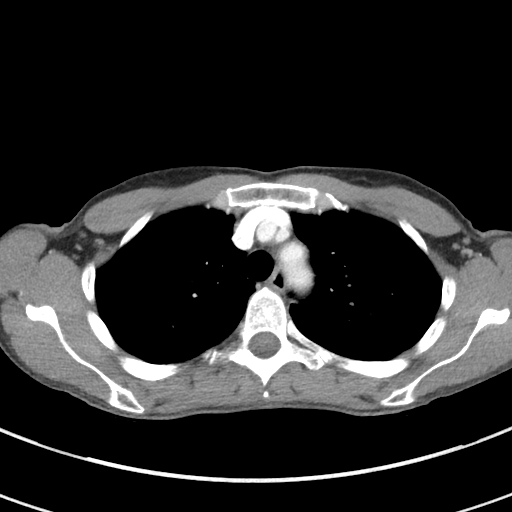
[im 78/87  soft-tissue]
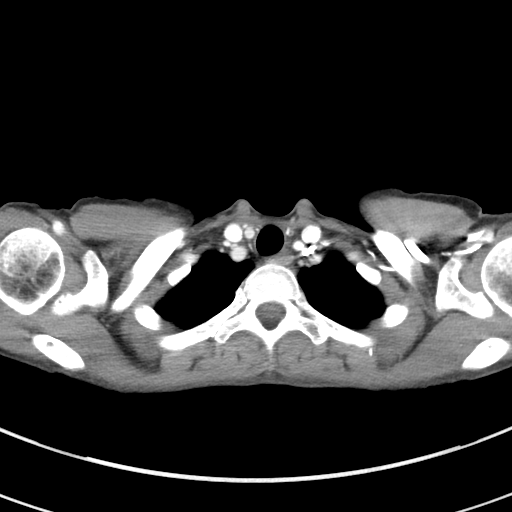
[im 78/87  bone]
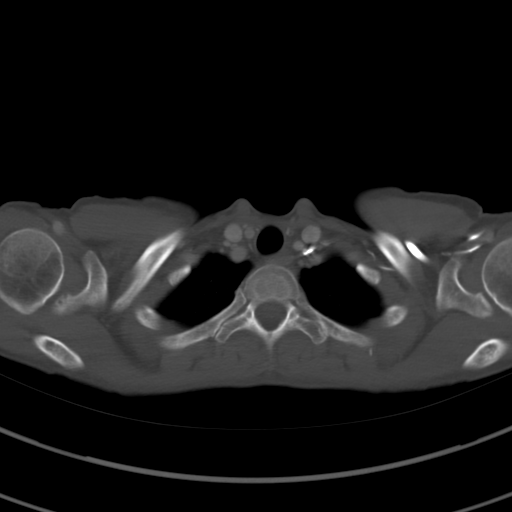

[Series 5: coronals · coronal · 0.55mm/px · 3 of 97 slices shown]
[im 33/97  soft-tissue]
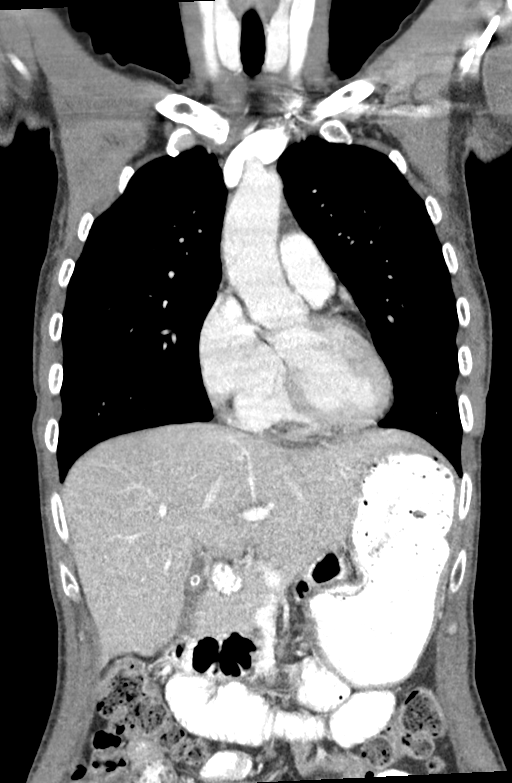
[im 43/97  soft-tissue]
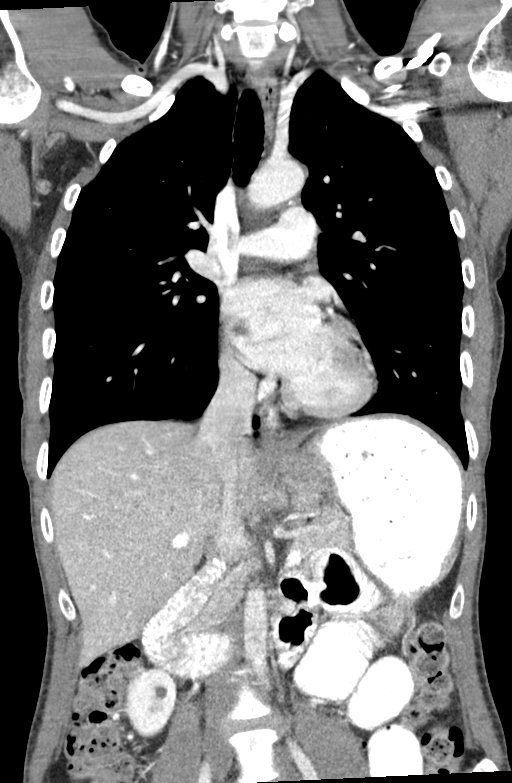
[im 54/97  soft-tissue]
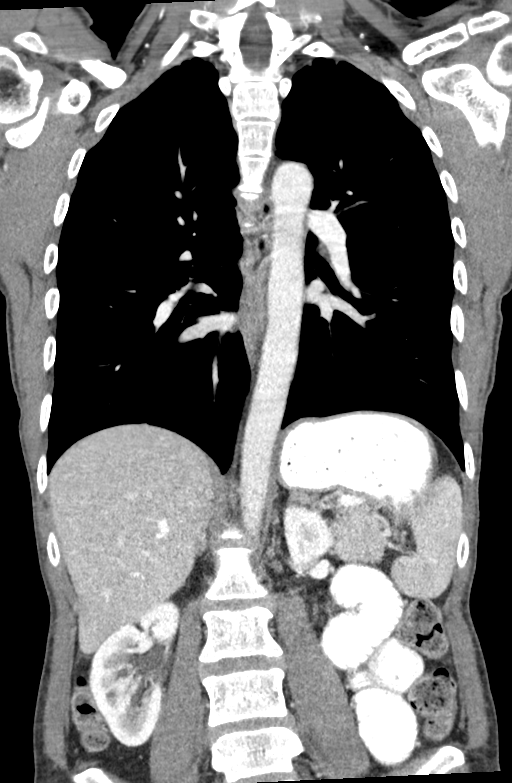

[Series 7: delay · axial · delayed · 0.82mm/px · z∈[-445,-385]mm · 2 of 37 slices shown]
[im 13/37  soft-tissue]
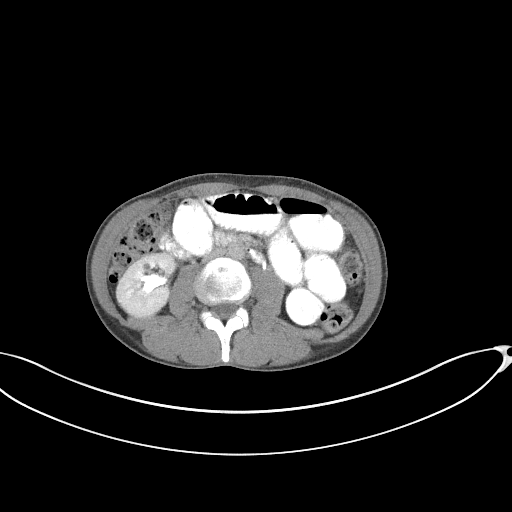
[im 25/37  soft-tissue]
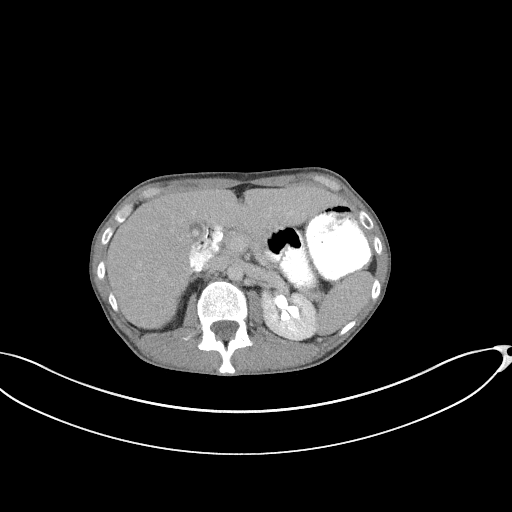

[14 of 46 positions shown; findings below may reference images not displayed]

FINDINGS: CT CHEST FINDINGS

Cardiovascular: No significant vascular findings. Normal heart size.
No pericardial effusion.

Mediastinum/Nodes: No enlarged mediastinal, hilar, or axillary lymph
nodes. Thyroid gland, trachea, and esophagus demonstrate no
significant findings.

Lungs/Pleura: Lungs are clear. No pleural effusion or pneumothorax.

Musculoskeletal: No chest wall mass or suspicious bone lesions
identified.

CT ABDOMEN FINDINGS

Hepatobiliary: Small solitary gallstone is noted. No biliary
dilatation is noted. The liver is unremarkable.

Pancreas: Unremarkable. No pancreatic ductal dilatation or
surrounding inflammatory changes.

Spleen: Normal in size without focal abnormality.

Adrenals/Urinary Tract: Adrenal glands are unremarkable. Small
nonobstructive left renal calculus is noted. No hydronephrosis is
noted. Bladder is unremarkable.

Stomach/Bowel: Stomach is unremarkable. Visualized portion of colon
is nondilated and filled with stool. Dilated small bowel loops are
noted concerning for ileus or distal small bowel obstruction.

Vascular/Lymphatic: No significant vascular findings are present. No
enlarged abdominal lymph nodes.

Other: No abdominal wall hernia or abnormality. No ascites.

Musculoskeletal: No acute or significant osseous findings.
IMPRESSION: No definite abnormality is noted in the chest.

Dilated small bowel loops are noted concerning for ileus or distal
small bowel obstruction. Pelvis was not included in this exam, and
therefore potential cause of obstruction is not included in
field-of-view. These results will be called to the ordering
clinician or representative by the Radiologist Assistant, and
communication documented in the PACS or zVision Dashboard.

Small solitary gallstone is noted.

Small nonobstructive left renal calculus. No hydronephrosis is
noted.

## 2022-04-07 ENCOUNTER — Other Ambulatory Visit: Payer: Self-pay | Admitting: Family Medicine

## 2022-04-07 DIAGNOSIS — F3340 Major depressive disorder, recurrent, in remission, unspecified: Secondary | ICD-10-CM

## 2022-04-07 NOTE — Telephone Encounter (Signed)
Requested medication (s) are due for refill todayyes  Requested medication (s) are on the active medication list:yes  Last refill:  01/10/22  Future visit scheduled: no  Notes to clinic:  Unable to refill per protocol, appointment needed.      Requested Prescriptions  Pending Prescriptions Disp Refills   traZODone (DESYREL) 50 MG tablet [Pharmacy Med Name: TRAZODONE 50MG  TABLETS] 90 tablet 0    Sig: TAKE 1 TABLET(50 MG) BY MOUTH AT BEDTIME     Psychiatry: Antidepressants - Serotonin Modulator Failed - 04/07/2022  7:02 AM      Failed - Completed PHQ-2 or PHQ-9 in the last 360 days      Failed - Valid encounter within last 6 months    Recent Outpatient Visits           1 year ago Annual physical exam   [facility], DO   1 year ago Unintentional weight loss   [facility], DO   1 year ago Vasovagal syncope   [facility], Althea Charon, DO   2 years ago Annual physical exam   [facility], DO   2 years ago Recurrent depressive disorder, current episode moderate (HCC)   [facility], Althea Charon, DO       Future Appointments             In 4 months Netta Neat, CNM [facility]

## 2022-07-03 ENCOUNTER — Telehealth: Payer: Self-pay

## 2022-07-03 NOTE — Telephone Encounter (Signed)
Copied from [place] [id number]. Topic: General - Inquiry >> Jul 03, 2022  8:46 AM Chapman Fitch wrote: Reason for CRM: Pt inquired on if she can get an emotional support animal form from Dr. Raliegh Ip / she has dogs and needs paperwork showing they are her emotional support animals and how she can get this for her two dogs /they are in the process of moving and they need this paperwork / please advise

## 2022-07-03 NOTE — Telephone Encounter (Signed)
Please let her know that I completed this today   Letter is sent to her mychart, printed and signed copy in my outbox. She can pick it up or use the one from [place]. Thanks  Nobie Putnam, [facility] 07/03/2022, 12:34 PM

## 2022-08-02 ENCOUNTER — Encounter: Payer: Self-pay | Admitting: Family Medicine

## 2022-08-02 ENCOUNTER — Ambulatory Visit (INDEPENDENT_AMBULATORY_CARE_PROVIDER_SITE_OTHER): Payer: Managed Care, Other (non HMO) | Admitting: Family Medicine

## 2022-08-02 VITALS — BP 138/70 | HR 61 | Ht 62.0 in | Wt 102.2 lb

## 2022-08-02 DIAGNOSIS — F411 Generalized anxiety disorder: Secondary | ICD-10-CM

## 2022-08-02 DIAGNOSIS — F331 Major depressive disorder, recurrent, moderate: Secondary | ICD-10-CM

## 2022-08-02 DIAGNOSIS — F5104 Psychophysiologic insomnia: Secondary | ICD-10-CM

## 2022-08-02 DIAGNOSIS — Z0000 Encounter for general adult medical examination without abnormal findings: Secondary | ICD-10-CM | POA: Diagnosis not present

## 2022-08-02 DIAGNOSIS — Z7189 Other specified counseling: Secondary | ICD-10-CM

## 2022-08-02 DIAGNOSIS — K909 Intestinal malabsorption, unspecified: Secondary | ICD-10-CM

## 2022-08-02 DIAGNOSIS — G43909 Migraine, unspecified, not intractable, without status migrainosus: Secondary | ICD-10-CM

## 2022-08-02 MED ORDER — BUTALBITAL-APAP-CAFF-COD 50-325-40-30 MG PO CAPS: ORAL_CAPSULE | ORAL | 2 refills | Status: DC

## 2022-08-02 MED ORDER — NURTEC 75 MG PO TBDP: 75.0000 mg | ORAL_TABLET | Freq: Every day | ORAL | 0 refills | Status: DC | PRN

## 2022-08-02 NOTE — Progress Notes (Signed)
Subjective:    Patient ID: Carmen Logan, female    DOB: 01-11-1971, 51 y.o.   MRN: [id number]  Carmen Logan is a 51 y.o. 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 Continues Sertraline 75mg  daily (1.5 tab) and Trazodone 50mg  nightly doing well, she prefers to avoid dose adjustment  Migraines episodic Stress triggering now having more frequent migraine headaches 4-14 migraine headache days per month Failed Sumatriptan, Rizatriptan Uses Fioricet AS NEEDED Tried Feverfew Herbal OTC Trial sample Nurtec ODT notify me if prefer rx  Gallbladder s/p cholecystectomy Now no more pain or gastritis, pain has resolved But has disrupted digestion IBS symptoms with both extremes   Small Hiatal Hernia Followed by Gen Surgery Dr Dahlia Byes Not surgical issue at this time.    Health Maintenance: Declines Flu Shot and COVID booster  Updated Cologuard 03/2020, next due 03/2023.      08/02/2022    1:39 PM 03/14/2021    8:56 AM 03/08/2020    9:35 AM  Depression screen PHQ 2/9  Decreased Interest 0 0 0  Down, Depressed, Hopeless 1 1 0  PHQ - 2 Score 1 1 0  Altered sleeping 1 1 0  Tired, decreased energy 1 1 0  Change in appetite 1 1 0  Feeling bad or failure about yourself  0 1 0  Trouble concentrating 0 0 0  Moving slowly or fidgety/restless 0 0 0  Suicidal thoughts 0 0 0  PHQ-9 Score 4 5 0  Difficult doing work/chores Somewhat difficult Somewhat difficult Not difficult at all      08/02/2022    1:39 PM 03/14/2021    8:57 AM 03/08/2020    9:35 AM 07/04/2019    1:48 PM  GAD 7 : Generalized Anxiety Score  Nervous, Anxious, on Edge 1 1 1 2   Control/stop worrying 0 1 0 3  Worry too much - different things 0 1 0 3  Trouble relaxing  1 0 0 2  Restless 0 0 0 2  Easily annoyed or irritable 1 0 0 3  Afraid - awful might happen 0 0 0 1  Total GAD 7 Score 3 3 1 16   Anxiety Difficulty Not difficult at all Somewhat difficult Not difficult at all Extremely difficult      Past Medical History:  Diagnosis Date   Anxiety    Cervical dysplasia    Depression    Dyspareunia, female    Endometriosis    Facial skin lesion    HPV test positive    Migraine    "seems weather related"   Surgical menopause    Wears contact lenses    sometimes   Past Surgical History:  Procedure Laterality Date   BIOPSY N/A 11/16/2020   Procedure: BIOPSY;  Surgeon: Virgel Manifold, MD;  Location: [facility];  Service: Endoscopy;  Laterality: N/A;   CERVICAL DISCECTOMY  03/17/2014   w/ fusion and plating   COLONOSCOPY WITH PROPOFOL N/A 11/16/2020   Procedure: COLONOSCOPY WITH PROPOFOL;  Surgeon: Virgel Manifold, MD;  Location: [place];  Service: Endoscopy;  Laterality: N/A;   CRYOTHERAPY     cervix   ESOPHAGOGASTRODUODENOSCOPY N/A 11/16/2020   Procedure: ESOPHAGOGASTRODUODENOSCOPY (EGD);  Surgeon: Virgel Manifold, MD;  [facility];  Service: Endoscopy;  Laterality: N/A;   TUBAL LIGATION  1999   VAGINAL HYSTERECTOMY  10/2009   Total vaginal hysterectomy, lso   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  Tobacco Use   Smoking status: Former    Types: Cigarettes    Quit date: 2015    Years since quitting: 8.9   Smokeless tobacco: Never   Tobacco comments:    Unsure duration  Vaping Use   Vaping Use: Former   Quit date: 02/25/2014  Substance and Sexual Activity   Alcohol use: Yes    Comment: occasional   Drug use: No   Sexual activity: Yes     Birth control/protection: Surgical  Other Topics Concern   Not on file  Social History Narrative   Not on file   Social Determinants of Health   Financial Resource Strain: Not on file  Food Insecurity: Not on file  Transportation Needs: Not on file  Physical Activity: Insufficiently Active (01/24/2018)   Exercise Vital Sign    Days of Exercise per Week: 1 day    Minutes of Exercise per Session: 30 min  Stress: Not on file  Social Connections: Not on file  Intimate Partner Violence: Not on file   Family History  Problem Relation Age of Onset   Heart disease Mother    Hypertension Mother    Thyroid disease Mother    Heart disease Father    Heart attack Father 58   Diabetes Maternal Aunt    Diabetes Maternal Grandmother    Depression Sister    Cancer Neg Hx    Breast cancer Neg Hx    Current Outpatient Medications on File Prior to Visit  Medication Sig   acetaminophen (TYLENOL) 500 MG chewable tablet Chew 500 mg by mouth every 6 (six) hours as needed for pain.   busPIRone (BUSPAR) 7.5 MG tablet TAKE 1 TABLET(7.5 MG) BY MOUTH TWICE DAILY AS NEEDED   Multiple Vitamin (MULTIVITAMIN WITH MINERALS) TABS tablet Take 1 tablet by mouth daily.   omeprazole (PRILOSEC) 20 MG capsule Take 1 capsule (20 mg total) by mouth daily. (Patient taking differently: Take 20 mg by mouth daily as needed (acid reflux).)   ondansetron (ZOFRAN-ODT) 4 MG disintegrating tablet DISSOLVE 1 TABLET(4 MG) ON THE TONGUE EVERY 8 HOURS AS NEEDED FOR NAUSEA OR VOMITING   sertraline (ZOLOFT) 50 MG tablet TAKE 1 AND 1/2 TABLETS(75 MG) BY MOUTH DAILY   traZODone (DESYREL) 50 MG tablet TAKE 1 TABLET(50 MG) BY MOUTH AT BEDTIME   No current facility-administered medications on file prior to visit.    Review of Systems  Constitutional:  Negative for activity change, appetite change, chills, diaphoresis, fatigue and fever.  HENT:  Negative for congestion and hearing loss.   Eyes:  Negative for visual disturbance.   Respiratory:  Negative for cough, chest tightness, shortness of breath and wheezing.   Cardiovascular:  Negative for chest pain, palpitations and leg swelling.  Gastrointestinal:  Negative for abdominal pain, constipation, diarrhea, nausea and vomiting.  Genitourinary:  Negative for dysuria, frequency and hematuria.  Musculoskeletal:  Negative for arthralgias and neck pain.  Skin:  Negative for rash.  Neurological:  Negative for dizziness, weakness, light-headedness, numbness and  headaches.  Hematological:  Negative for adenopathy.  Psychiatric/Behavioral:  Negative for behavioral problems, dysphoric mood and sleep disturbance.    Per HPI unless specifically indicated above      Objective:    BP 138/70   Pulse 61   Ht 5\' 2"  (1.575 m)   Wt 102 lb 3.2 oz (46.4 kg)   SpO2 100%   BMI 18.69 kg/m   Wt Readings from Last 3 Encounters:  08/02/22 102 lb 3.2 oz (46.4 kg)  09/12/21 101 lb 9.6 oz (46.1 kg)  08/19/21 93 lb 14.7 oz (42.6 kg)    Physical Exam Vitals and nursing note reviewed.  Constitutional:      General: She is not in acute distress.    Appearance: She is well-developed. She is not diaphoretic.     Comments: Well-appearing, comfortable, cooperative  HENT:     Head: Normocephalic and atraumatic.  Eyes:     General:        Right eye: No discharge.        Left eye: No discharge.     Conjunctiva/sclera: Conjunctivae normal.     Pupils: Pupils are equal, round, and reactive to light.  Neck:     Thyroid: No thyromegaly.     Vascular: No carotid bruit.  Cardiovascular:     Rate and Rhythm: Normal rate and regular rhythm.     Pulses: Normal pulses.     Heart sounds: Normal heart sounds. No murmur heard. Pulmonary:     Effort: Pulmonary effort is normal. No respiratory distress.     Breath sounds: Normal breath sounds. No wheezing or rales.  Abdominal:     General: Bowel sounds are normal. There is no distension.     Palpations: Abdomen is soft. There is no mass.      Tenderness: There is no abdominal tenderness.  Musculoskeletal:        General: No tenderness. Normal range of motion.     Cervical back: Normal range of motion and neck supple.     Right lower leg: No edema.     Left lower leg: No edema.     Comments: Upper / Lower Extremities: - Normal muscle tone, strength bilateral upper extremities 5/5, lower extremities 5/5  Lymphadenopathy:     Cervical: No cervical adenopathy.  Skin:    General: Skin is warm and dry.     Findings: No erythema or rash.  Neurological:     Mental Status: She is alert and oriented to person, place, and time.     Comments: Distal sensation intact to light touch all extremities  Psychiatric:        Mood and Affect: Mood normal.        Behavior: Behavior normal.        Thought Content: Thought content normal.     Comments: Well groomed, good eye contact, normal speech and thoughts, tearful crying spell       Results for orders placed or performed during the hospital encounter of 08/19/21  Surgical pathology  Result Value Ref Range   SURGICAL PATHOLOGY      SURGICAL PATHOLOGY CASE: [id number] PATIENT: Jacqeline Viall Surgical Pathology Report     Specimen Submitted: A. Gallbladder  Clinical History: Biliary colic    DIAGNOSIS: A. 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 - NEGATIVE FOR DYSPLASIA AND MALIGNANCY.  GROSS DESCRIPTION: A. Labeled: Gallbladder Received: Fresh Collection time: 11:12 AM on 08/19/2021 Placed into formalin time: 12:32 PM on 08/19/2021 Size of  specimen: 6.5 x 3.3 x 1.8 cm Specimen integrity: Intact External surface: Pink-purple and glistening with a roughened hepatic surface Wall thickness: 0.1 cm Mucosa: Tan and velvety with a mild amount of diffuse cholesterolosis Cystic duct: The cystic duct is 1.0 x 0.3 x 0.1 cm and patent.  No adjacent lymph nodes are identified. Bile present: Yes, green and  viscous Stones present: Yes, 1 green-black stone is present, 1.2 x 0.7 x 0.7 cm Other findings: None noted  Block summary: 1 -cyst ic duct margin, en face and inked black with representative wall  [facility] 08/19/2021  Final Diagnosis performed by Katherine Mantle, MD.   Electronically signed 08/23/2021 9:13:19AM The electronic signature indicates that the named Attending Pathologist has evaluated the specimen Technical component performed at [facility], [address] [place], [place] [address] Lab: [phone] Dir: Jolene Schimke, MD, MMM  Professional component performed at [facility], [facility], [address] [place], [place], [place] [address] Lab: [phone] Dir: Beryle Quant, MD       Assessment & Plan:   Problem List Items Addressed This Visit     Episodic migraine   Relevant Medications   NURTEC 75 MG TBDP   butalbital-apap-caffeine-codeine (FIORICET WITH CODEINE) 50-325-40-30 MG capsule   GAD (generalized anxiety disorder)   Insomnia   Moderate recurrent major depression (HCC)   Other Visit Diagnoses     Annual physical exam    -  Primary   Relevant Orders   CBC with Differential/Platelet   Lipid panel   Hemoglobin A1c   TSH   T4, free   Comprehensive metabolic panel   Bereavement counseling       Diarrhea due to malabsorption           Updated Health Maintenance information Orders for [facility] for fasting labs upcoming. Encouraged improvement to lifestyle with diet and exercise Goal of weight loss  IBS vs s/p cholecystectomy digestion change Follow up as discussed if still difficulty regulating diet  Note she has come off Estradiol now with upcoming menopause. She follows w GYN  Episodic migraine headaches >4-14 headache days per month  Start sample Nurtec ODT take one if severe migraine and you can repeat in 24-48 hours if needed  We use [facility], mail order specialty pharmacy, first 2 months should be free or covered.  You can use a  copay discount card in future and get it from [facility] or from local pharmacy if you prefer.  Anticipate ordering Nurtec ODT as preventative 16 per month if she is interested.  Please let me know if need any other assistance with mood and mental health and if the medications are currently working.   Meds ordered this encounter  Medications   NURTEC 75 MG TBDP    Sig: Take 75 mg by mouth daily as needed (migraine headache). Max 1 tablet in 24 hours.    Dispense:  8 tablet    Refill:  0   butalbital-apap-caffeine-codeine (FIORICET WITH CODEINE) 50-325-40-30 MG capsule    Sig: TAKE 1 TO 2 CAPSULES BY MOUTH EVERY 4 HOURS AS NEEDED FOR HEADACHE OR MIGRAINE. NO MORE THAN 6 CAPSULES DAILY    Dispense:  30 capsule    Refill:  2      Follow up plan: Return in about 3 months (around 11/01/2022) for 3 month follow-up Mood/Anxiety/Migraine / IBS.  Saralyn Pilar, DO [facility] 08/02/2022, 1:31 PM

## 2022-08-02 NOTE — Patient Instructions (Addendum)
Thank you for coming to the office today.  Start sample Nurtec ODT take one if severe migraine and you can repeat in 24-48 hours if needed  We use [facility], mail order specialty pharmacy, first 2 months should be free or covered.  You can use a copay discount card in future and get it from [facility] or from local pharmacy if you prefer.  Please let me know if it works and we can order it as a prevention, 16 pills per month take one every other day OR just 8 per month take only if headache.  Please let me know if need any other assistance with mood and mental health and if the medications are currently working.  Please schedule a Follow-up Appointment to: Return in about 3 months (around 11/01/2022) for 3 month follow-up Mood/Anxiety/Migraine / IBS.  If you have any other questions or concerns, please feel free to call the office or send a message through MyChart. You may also schedule an earlier appointment if necessary.  Additionally, you may be receiving a survey about your experience at our office within a few days to 1 week by e-mail or mail. We value your feedback.  Saralyn Pilar, DO [facility], [place]

## 2022-08-09 LAB — COMPREHENSIVE METABOLIC PANEL
ALT: 15 IU/L (ref 0–32)
AST: 15 IU/L (ref 0–40)
Albumin/Globulin Ratio: 1.7 (ref 1.2–2.2)
Albumin: 4.6 g/dL (ref 3.9–4.9)
Alkaline Phosphatase: 83 IU/L (ref 44–121)
BUN/Creatinine Ratio: 10 (ref 9–23)
BUN: 9 mg/dL (ref 6–24)
Bilirubin Total: 0.6 mg/dL (ref 0.0–1.2)
CO2: 24 mmol/L (ref 20–29)
Calcium: 9.4 mg/dL (ref 8.7–10.2)
Chloride: 100 mmol/L (ref 96–106)
Creatinine, Ser: 0.88 mg/dL (ref 0.57–1.00)
Globulin, Total: 2.7 g/dL (ref 1.5–4.5)
Glucose: 85 mg/dL (ref 70–99)
Potassium: 4.1 mmol/L (ref 3.5–5.2)
Sodium: 140 mmol/L (ref 134–144)
Total Protein: 7.3 g/dL (ref 6.0–8.5)
eGFR: 81 mL/min/{1.73_m2} (ref >59)

## 2022-08-09 LAB — CBC WITH DIFFERENTIAL/PLATELET
Basophils Absolute: 0 10*3/uL (ref 0.0–0.2)
Basos: 1 %
EOS (ABSOLUTE): 0.1 10*3/uL (ref 0.0–0.4)
Eos: 1 %
Hematocrit: 41.1 % (ref 34.0–46.6)
Hemoglobin: 13.8 g/dL (ref 11.1–15.9)
Immature Grans (Abs): 0 10*3/uL (ref 0.0–0.1)
Immature Granulocytes: 0 %
Lymphocytes Absolute: 1.8 10*3/uL (ref 0.7–3.1)
Lymphs: 32 %
MCH: 30.2 pg (ref 26.6–33.0)
MCHC: 33.6 g/dL (ref 31.5–35.7)
MCV: 90 fL (ref 79–97)
Monocytes Absolute: 0.3 10*3/uL (ref 0.1–0.9)
Monocytes: 6 %
Neutrophils Absolute: 3.3 10*3/uL (ref 1.4–7.0)
Neutrophils: 60 %
Platelets: 212 10*3/uL (ref 150–450)
RBC: 4.57 x10E6/uL (ref 3.77–5.28)
RDW: 12.4 % (ref 11.7–15.4)
WBC: 5.5 10*3/uL (ref 3.4–10.8)

## 2022-08-09 LAB — LIPID PANEL
Chol/HDL Ratio: 2.8 ratio (ref 0.0–4.4)
Cholesterol, Total: 209 mg/dL — ABNORMAL HIGH (ref 100–199)
HDL: 74 mg/dL (ref >39)
LDL Chol Calc (NIH): 119 mg/dL — ABNORMAL HIGH (ref 0–99)
Triglycerides: 93 mg/dL (ref 0–149)
VLDL Cholesterol Cal: 16 mg/dL (ref 5–40)

## 2022-08-09 LAB — T4, FREE: Free T4: 1.09 ng/dL (ref 0.82–1.77)

## 2022-08-09 LAB — TSH: TSH: 2.94 u[IU]/mL (ref 0.450–4.500)

## 2022-08-09 LAB — HEMOGLOBIN A1C
Est. average glucose Bld gHb Est-mCnc: 103 mg/dL
Hgb A1c MFr Bld: 5.2 % (ref 4.8–5.6)

## 2022-08-15 ENCOUNTER — Encounter: Payer: Managed Care, Other (non HMO) | Admitting: Certified Nurse Midwife

## 2022-09-01 ENCOUNTER — Encounter: Payer: Self-pay | Admitting: Family Medicine

## 2022-10-16 ENCOUNTER — Other Ambulatory Visit: Payer: Self-pay | Admitting: Family Medicine

## 2022-10-16 DIAGNOSIS — F331 Major depressive disorder, recurrent, moderate: Secondary | ICD-10-CM

## 2022-10-16 MED ORDER — SERTRALINE HCL 50 MG PO TABS: ORAL_TABLET | ORAL | 3 refills | Status: DC

## 2022-11-14 ENCOUNTER — Encounter: Payer: Self-pay | Admitting: Family Medicine

## 2022-11-14 ENCOUNTER — Ambulatory Visit: Payer: Managed Care, Other (non HMO) | Admitting: Family Medicine

## 2022-11-14 VITALS — BP 102/72 | HR 80 | Ht 62.0 in | Wt 100.4 lb

## 2022-11-14 DIAGNOSIS — F419 Anxiety disorder, unspecified: Secondary | ICD-10-CM

## 2022-11-14 DIAGNOSIS — F332 Major depressive disorder, recurrent severe without psychotic features: Secondary | ICD-10-CM | POA: Diagnosis not present

## 2022-11-14 DIAGNOSIS — M5030 Other cervical disc degeneration, unspecified cervical region: Secondary | ICD-10-CM | POA: Diagnosis not present

## 2022-11-14 DIAGNOSIS — G43909 Migraine, unspecified, not intractable, without status migrainosus: Secondary | ICD-10-CM | POA: Diagnosis not present

## 2022-11-14 DIAGNOSIS — G4486 Cervicogenic headache: Secondary | ICD-10-CM | POA: Diagnosis not present

## 2022-11-14 DIAGNOSIS — F5104 Psychophysiologic insomnia: Secondary | ICD-10-CM

## 2022-11-14 MED ORDER — ESCITALOPRAM OXALATE 10 MG PO TABS: 10.0000 mg | ORAL_TABLET | Freq: Every day | ORAL | 1 refills | Status: DC

## 2022-11-14 MED ORDER — TRAZODONE HCL 50 MG PO TABS: 50.0000 mg | ORAL_TABLET | Freq: Every day | ORAL | 1 refills | Status: DC

## 2022-11-14 NOTE — Progress Notes (Signed)
Subjective:    Patient ID: Carmen Logan, female    DOB: December 07, 1970, 52 y.o.   MRN: [id number]  Carmen Logan is a 52 y.o. female presenting on 11/14/2022 for Follow-up (3 month follow up and Pt. Wants to talk about medications. )   HPI  Major depression recurrent active, Severe Insomnia Her mother, who was my patient, passed 06/30/22 Major stressor for her with complicated bereavement affecting her  Her goal today is to discuss depression, she has self discontinued Sertraline 6 months ago previously was on 75mg  daily Admits mood is now becoming an issue with major life stressors, she feels very overwhelmed. Her mother has passed in July 01, 2023 - [facility] offers therapist but she has not pursued this yet - She takes Buspar 7.5mg  twice a day AS NEEDED, takes only when feels like she has anxiety attack panic coming on, feels like her anxiety is not as bad as her depression, she will take it AS NEEDED. - Depression is significantly worsening lately. Admits suicidal ideation. - Still taking Trazodone 50mg  nightly it is helpful for her sleep - Previously on Zoloft only, no other SSRI, but has tried Effexor SNRI   Migraines episodic vs Cervicogenic Headaches Stress triggering now having more frequent migraine headaches 4-14 migraine headache days per month Failed Sumatriptan, Rizatriptan Uses Fioricet AS NEEDED Last visit. Tried Nurtec ODT sample and it was not effective. Repeat dose Nurtec 48 hours later and still no relief. She had to take the Fioricet Worsening migraine at night, and aura interferes with her vision and her ability to work [employer] referral for migraine today - She has history of plate and screws in cervical spine, she has some associated numbness, believes this can cause some of her migraines. Interested in [employer]   Gallbladder s/p cholecystectomy Now no more pain or gastritis, pain has resolved But has disrupted digestion IBS symptoms with  both extremes  Takes Fiber daily Will have variable symptoms with IBS with both diarrhea and some constipation  Unable to Gain Weight Still struggling with difficulty gaining weight Our scale shows 100 lbs 6 oz and previously 08/2021 weight was 101 lbs 10 oz and then further back into 2022 she was avg 94-96 lbs. Her scale shows 94-96 lbs      11/14/2022    1:21 PM 08/02/2022    1:39 PM 03/14/2021    8:56 AM  Depression screen PHQ 2/9  Decreased Interest 1 0 0  Down, Depressed, Hopeless 2 1 1   PHQ - 2 Score 3 1 1   Altered sleeping 1 1 1   Tired, decreased energy 0 1 1  Change in appetite 0 1 1  Feeling bad or failure about yourself  3 0 1  Trouble concentrating 0 0 0  Moving slowly or fidgety/restless 0 0 0  Suicidal thoughts 1 0 0  PHQ-9 Score 8 4 5   Difficult doing work/chores Not difficult at all Somewhat difficult Somewhat difficult   [place]-Suicide Severity Rating Scale 1) Have you wished you were dead or wished you could go to sleep and not wake up? - Yes  2) Have you had any actual thoughts of killing yourself? - No  Skip questions 3,4, 5  6) Have you ever done anything, started to do anything, or prepared to do anything to end your life? - No      11/14/2022    1:22 PM 08/02/2022    1:39 PM 03/14/2021    8:57 AM 03/08/2020    9:35  AM  GAD 7 : Generalized Anxiety Score  Nervous, Anxious, on Edge 2 1 1 1   Control/stop worrying 2 0 1 0  Worry too much - different things 2 0 1 0  Trouble relaxing 1 1 0 0  Restless 0 0 0 0  Easily annoyed or irritable 0 1 0 0  Afraid - awful might happen 0 0 0 0  Total GAD 7 Score 7 3 3 1   Anxiety Difficulty Not difficult at all Not difficult at all Somewhat difficult Not difficult at all    Past Surgical History:  Procedure Laterality Date   BIOPSY N/A 11/16/2020   Procedure: BIOPSY;  Surgeon: Virgel Manifold, MD;  Location: [place] [facility];  Service: Endoscopy;  Laterality: N/A;   CERVICAL DISCECTOMY  03/17/2014    w/ fusion and plating   COLONOSCOPY WITH PROPOFOL N/A 11/16/2020   Procedure: COLONOSCOPY WITH PROPOFOL;  Surgeon: Virgel Manifold, MD;  Location: [facility];  Service: Endoscopy;  Laterality: N/A;   CRYOTHERAPY     cervix   ESOPHAGOGASTRODUODENOSCOPY N/A 11/16/2020   Procedure: ESOPHAGOGASTRODUODENOSCOPY (EGD);  Surgeon: Virgel Manifold, MD;  Location: [facility];  Service: Endoscopy;  Laterality: N/A;   TUBAL LIGATION  1999   VAGINAL HYSTERECTOMY  10/2009   Total vaginal hysterectomy, lso     Social History   Tobacco Use   Smoking status: Former    Types: Cigarettes    Quit date: 2015    Years since quitting: 9.2   Smokeless tobacco: Never   Tobacco comments:    Unsure duration  Vaping Use   Vaping Use: Former   Quit date: 02/25/2014  Substance Use Topics   Alcohol use: Yes    Comment: occasional   Drug use: No    Review of Systems Per HPI unless specifically indicated above     Objective:    BP 102/72 (BP Location: Left Arm, Patient Position: Sitting)   Pulse 80   Ht 5\' 2"  (1.575 m)   Wt 100 lb 6.4 oz (45.5 kg)   SpO2 98%   BMI 18.36 kg/m   Wt Readings from Last 3 Encounters:  11/14/22 100 lb 6.4 oz (45.5 kg)  08/02/22 102 lb 3.2 oz (46.4 kg)  09/12/21 101 lb 9.6 oz (46.1 kg)    Physical Exam Vitals and nursing note reviewed.  Constitutional:      General: She is not in acute distress.    Appearance: She is well-developed. She is not diaphoretic.     Comments: Well-appearing, comfortable, cooperative  HENT:     Head: Normocephalic and atraumatic.  Eyes:     General:        Right eye: No discharge.        Left eye: No discharge.     Conjunctiva/sclera: Conjunctivae normal.  Neck:     Thyroid: No thyromegaly.  Cardiovascular:     Rate and Rhythm: Normal rate and regular rhythm.     Heart sounds: Normal heart sounds. No murmur heard. Pulmonary:     Effort: Pulmonary effort is normal. No respiratory distress.     Breath  sounds: Normal breath sounds. No wheezing or rales.  Musculoskeletal:        General: Normal range of motion.     Cervical back: Normal range of motion and neck supple.  Lymphadenopathy:     Cervical: No cervical adenopathy.  Skin:    General: Skin is warm and dry.     Findings: No erythema  or rash.  Neurological:     Mental Status: She is alert and oriented to person, place, and time.  Psychiatric:        Behavior: Behavior normal.     Comments: Well groomed, good eye contact, normal speech and thoughts. Depressed mood. Tearful, crying.    Results for orders placed or performed in visit on 08/02/22  CBC with Differential/Platelet  Result Value Ref Range   WBC 5.5 3.4 - 10.8 x10E3/uL   RBC 4.57 3.77 - 5.28 x10E6/uL   Hemoglobin 13.8 11.1 - 15.9 g/dL   Hematocrit 41.1 34.0 - 46.6 %   MCV 90 79 - 97 fL   MCH 30.2 26.6 - 33.0 pg   MCHC 33.6 31.5 - 35.7 g/dL   RDW 12.4 11.7 - 15.4 %   Platelets 212 150 - 450 x10E3/uL   Neutrophils 60 Not Estab. %   Lymphs 32 Not Estab. %   Monocytes 6 Not Estab. %   Eos 1 Not Estab. %   Basos 1 Not Estab. %   Neutrophils Absolute 3.3 1.4 - 7.0 x10E3/uL   Lymphocytes Absolute 1.8 0.7 - 3.1 x10E3/uL   Monocytes Absolute 0.3 0.1 - 0.9 x10E3/uL   EOS (ABSOLUTE) 0.1 0.0 - 0.4 x10E3/uL   Basophils Absolute 0.0 0.0 - 0.2 x10E3/uL   Immature Granulocytes 0 Not Estab. %   Immature Grans (Abs) 0.0 0.0 - 0.1 x10E3/uL  Lipid panel  Result Value Ref Range   Cholesterol, Total 209 (H) 100 - 199 mg/dL   Triglycerides 93 0 - 149 mg/dL   HDL 74 >39 mg/dL   VLDL Cholesterol Cal 16 5 - 40 mg/dL   LDL Chol Calc (NIH) 119 (H) 0 - 99 mg/dL   Chol/HDL Ratio 2.8 0.0 - 4.4 ratio  Hemoglobin A1c  Result Value Ref Range   Hgb A1c MFr Bld 5.2 4.8 - 5.6 %   Est. average glucose Bld gHb Est-mCnc 103 mg/dL  TSH  Result Value Ref Range   TSH 2.940 0.450 - 4.500 uIU/mL  T4, free  Result Value Ref Range   Free T4 1.09 0.82 - 1.77 ng/dL  Comprehensive metabolic  panel  Result Value Ref Range   Glucose 85 70 - 99 mg/dL   BUN 9 6 - 24 mg/dL   Creatinine, Ser 0.88 0.57 - 1.00 mg/dL   eGFR 81 >59 mL/min/1.73   BUN/Creatinine Ratio 10 9 - 23   Sodium 140 134 - 144 mmol/L   Potassium 4.1 3.5 - 5.2 mmol/L   Chloride 100 96 - 106 mmol/L   CO2 24 20 - 29 mmol/L   Calcium 9.4 8.7 - 10.2 mg/dL   Total Protein 7.3 6.0 - 8.5 g/dL   Albumin 4.6 3.9 - 4.9 g/dL   Globulin, Total 2.7 1.5 - 4.5 g/dL   Albumin/Globulin Ratio 1.7 1.2 - 2.2   Bilirubin Total 0.6 0.0 - 1.2 mg/dL   Alkaline Phosphatase 83 44 - 121 IU/L   AST 15 0 - 40 IU/L   ALT 15 0 - 32 IU/L      Assessment & Plan:   Problem List Items Addressed This Visit     Cervicogenic headache   Relevant Medications   escitalopram (LEXAPRO) 10 MG tablet   traZODone (DESYREL) 50 MG tablet   Other Relevant Orders   Ambulatory referral to Neurology   Episodic migraine   Relevant Medications   escitalopram (LEXAPRO) 10 MG tablet   traZODone (DESYREL) 50 MG tablet   Other Relevant  Orders   Ambulatory referral to Neurology   Insomnia   Relevant Orders   Ambulatory referral to Psychiatry   Severe episode of recurrent major depressive disorder ([place]) - Primary   Relevant Medications   escitalopram (LEXAPRO) 10 MG tablet   traZODone (DESYREL) 50 MG tablet   Other Relevant Orders   Ambulatory referral to Psychiatry   Other Visit Diagnoses     DDD (degenerative disc disease), cervical       Relevant Orders   Ambulatory referral to Neurology   Anxiety       Relevant Medications   escitalopram (LEXAPRO) 10 MG tablet   traZODone (DESYREL) 50 MG tablet   Other Relevant Orders   Ambulatory referral to Psychiatry      Severe Major Depression recurrent  Discussion today with her worsening depression, after multiple major life stressors, and also she has self discontinued Zoloft due to ineffectiveness.  Long discussion on management options. Given the severity of her symptoms, and difficulty  managing this issue now at a severe flare up, we agree that both medication management referral to mental health professional is warranted.  She will check into options given on AVS for psych and check her insurance. I recommend both psychiatry MD and therapist involved going forward.  Failed Zoloft, Venlafaxine  I will also start back on SSRI therapy with Lexapro 10mg  daily, she prefers to avoid side effects. Considered Wellbutrin but she has issue with already reduced appetite wt loss, so we will avoid this.  Reviewed safety contact #s per AVS and she feels safe and has support system family and friends, and knows how to reach out for help if feels unsafe regarding her reported suicidal ideation and thoughts.  referral to Psychiatry and therapist for severe major depression recurrent with anxiety, insomnia, unintentional weight loss,  significant life stressors with loss of her mother and various other stressors, she has past history of depression recurrent. Failed Zoloft. Trial on Lexapro now and already on Buspar.     Episodic migraine headaches vs Cervicogenic headaches Chronic migraines and headaches, now discussion today we are questioning if there is Cervical spine DDD component, prior spine surgery 2015 w anterior cervical discectomy and fusion / plating, question if this seems to be contributing to posterior headaches that may be triggering or related to migraines  Failed Triptans, and Nurtec ODT recently. Questioning diagnosis.  Will give sample Ubrelvy today and referral to Neurology for further management  She would be due for updated Neck imaging as well in future.  Note she is improved sometimes only on Fioricet, this would to me suggest maybe it is more than migraine   Orders Placed This Encounter  Procedures   Ambulatory referral to Neurology    Referral Priority:   Routine    Referral Type:   Consultation    Referral Reason:   Specialty Services Required    Requested  Specialty:   Neurology    Number of Visits Requested:   1   Ambulatory referral to Psychiatry    Referral Priority:   Routine    Referral Type:   Psychiatric    Referral Reason:   Specialty Services Required    Requested Specialty:   Psychiatry    Number of Visits Requested:   1     Meds ordered this encounter  Medications   escitalopram (LEXAPRO) 10 MG tablet    Sig: Take 1 tablet (10 mg total) by mouth daily.    Dispense:  90 tablet  Refill:  1   traZODone (DESYREL) 50 MG tablet    Sig: Take 1 tablet (50 mg total) by mouth at bedtime.    Dispense:  90 tablet    Refill:  1      Follow up plan: Return in about 6 weeks (around 12/26/2022) for 6 week follow-up MyChart Video visit Depression/Anxiety / Migraine.   Nobie Putnam, [facility] 11/14/2022, 1:26 PM

## 2022-11-14 NOTE — Patient Instructions (Addendum)
Thank you for coming to the office today.  Dr Sarina Ill (Migraine Specialist)  [facility]   Address: [address] [place], [place] [address] Hours: 8AM-5PM Phone: [phone]  If not heard back in 2 weeks w/ apt you can call them  -----------------------------  Future we can reconsider referral to GI and other management if needed.  [facility] - OTC Peppermint Oil (Triple Coated Capsule) 180mg  take one 3 times daily to reduce diarrhea  ---------------   If thoughts of harming yourself or others, or any significant concern about your safety, please call for help immediately:  - [place] or [phone] - Suicide prevention hotline [phone]  - 911  Start Escitalopram (lexapro) 10mg  daily- if not effective within 2-4 weeks, we can adjust dose and increase up to 20mg  in near future.   These offices have both PSYCHIATRY doctors and [place] (Virtual Available) [place] [address] [place], [place] [address] Phone: [phone]  [facility] Address: [address], [place], [place] [address] [email or web address] Phone: [phone]  [place] ([place] [facility]) Address: [place], [place], [place] [address] Hours: 8:30AM-5PM Phone: [phone]  [facility] [place] (Adult, [place], Geriatric, Counseling) [address] [place], [place] [address] Phone: [phone] Fax: [phone]  [place] [facility] [place], [place] [address] Phone: [phone]  [facility] (All ages) [address] [place], [id number] Phone: [phone] (Option 1) [email or web address]  ----------------------------------------------------------------- THERAPIST ONLY  (No  Psychiatry)  [facility] [address], [place] [address] [place] P: [phone]  [facility]) [facility], [facility] [address] [place], [place] [address] [phone]  [facility].   Address: [address] [place], [place] [address] Hours: Open today  9AM-7PM Phone: [phone]  [facility] [address], [address] Address: [address] [place], [place], [place] [address] Phone: [phone]  [facility] [place], [place] [address] Phone: [phone]     Please schedule a Follow-up Appointment to: Return in about 6 weeks (around 12/26/2022) for 6 week follow-up MyChart Video visit Depression/Anxiety / Migraine.  If you have any other questions or concerns, please feel free to call the office or send a message through [place]. You may also schedule an earlier appointment if necessary.  Additionally, you may be receiving a survey about your experience at our office within a few days to 1 week by e-mail or mail. We value your feedback.  Nobie Putnam, DO [place]

## 2022-11-15 ENCOUNTER — Encounter: Payer: Self-pay | Admitting: Family Medicine

## 2022-12-06 ENCOUNTER — Other Ambulatory Visit: Payer: Self-pay | Admitting: Family Medicine

## 2022-12-06 DIAGNOSIS — G43909 Migraine, unspecified, not intractable, without status migrainosus: Secondary | ICD-10-CM

## 2022-12-06 NOTE — Telephone Encounter (Signed)
Requested medication (s) are due for refill today: yes  Requested medication (s) are on the active medication list: yes    Last refill: 08/02/22  #30  2 refills  Future visit scheduled yes  12/26/22  Notes to clinic Not delegated, please review. Thank you.  Requested Prescriptions  Pending Prescriptions Disp Refills   butalbital-apap-caffeine-codeine (FIORICET WITH CODEINE) 50-325-40-30 MG capsule [Pharmacy Med Name: BUT/ACETA/CAFF/COD CAP 50-325-40-30] 30 capsule     Sig: TAKE 1 TO 2 CAPSULES BY MOUTH EVERY 4 HOURS AS NEEDED FOR HEADACHE OR MIGRAINE. NO MORE THAN 6 CAPSULES DAILY     Not Delegated - Analgesics:  Opioid Agonist Combinations 2 Failed - 12/06/2022  4:10 AM      Failed - This refill cannot be delegated      Failed - Urine Drug Screen completed in last 360 days      Passed - Cr in normal range and within 360 days    Creatinine  Date Value Ref Range Status  11/08/2011 0.78 0.60 - 1.30 mg/dL Final   Creatinine, Ser  Date Value Ref Range Status  08/08/2022 0.88 0.57 - 1.00 mg/dL Final         Passed - eGFR is 10 or above and within 360 days    EGFR (African American)  Date Value Ref Range Status  11/08/2011 >60 >37mL/min Final   GFR calc Af Amer  Date Value Ref Range Status  10/20/2020 97 >59 mL/min/1.73 Final    Comment:    **In accordance with recommendations from the NKF-ASN Task force,**   [facility] is in the process of updating its eGFR calculation to the   2021 CKD-EPI creatinine equation that estimates kidney function   without a race variable.    EGFR (Non-African Amer.)  Date Value Ref Range Status  11/08/2011 >60 >42mL/min Final    Comment:    eGFR values <52mL/min/1.73 m2 may be an indication of chronic kidney disease (CKD). Calculated eGFR, using the MRDR Study equation, is useful in  patients with stable renal function. The eGFR calculation will not be reliable in acutely ill patients when serum creatinine is changing rapidly. It is not useful  in patients on dialysis. The eGFR calculation may not be applicable to patients at the low and high extremes of body sizes, pregnant women, and vegetarians.    GFR calc non Af Amer  Date Value Ref Range Status  10/20/2020 84 >59 mL/min/1.73 Final   eGFR  Date Value Ref Range Status  08/08/2022 81 >59 mL/min/1.73 Final         Passed - Patient is not pregnant      Passed - Valid encounter within last 3 months    Recent Outpatient Visits           3 weeks ago Severe episode of recurrent major depressive disorder, without psychotic features [facility])   [place] [facility] [place], Netta Neat, DO   4 months ago Annual physical exam   [facility] Smitty Cords, DO   1 year ago Annual physical exam   [place] [facility] Smitty Cords, DO   2 years ago Unintentional weight loss   [facility] Smitty Cords, DO   2 years ago Vasovagal syncope    [facility] Smitty Cords, DO       Future Appointments  In 2 weeks Althea Charon, Netta Neat, DO [place] [facility] [place], [facility]

## 2022-12-26 ENCOUNTER — Encounter: Payer: Self-pay | Admitting: Family Medicine

## 2022-12-26 ENCOUNTER — Telehealth: Payer: Managed Care, Other (non HMO) | Admitting: Family Medicine

## 2022-12-26 VITALS — Ht 62.0 in | Wt 100.0 lb

## 2022-12-26 DIAGNOSIS — F411 Generalized anxiety disorder: Secondary | ICD-10-CM

## 2022-12-26 DIAGNOSIS — G43909 Migraine, unspecified, not intractable, without status migrainosus: Secondary | ICD-10-CM

## 2022-12-26 DIAGNOSIS — F5104 Psychophysiologic insomnia: Secondary | ICD-10-CM | POA: Diagnosis not present

## 2022-12-26 DIAGNOSIS — F3341 Major depressive disorder, recurrent, in partial remission: Secondary | ICD-10-CM

## 2022-12-26 NOTE — Patient Instructions (Addendum)
Thank you for coming to the office today.  Keep on current treatment plan with Escitalopram 10mg  daily, buspar and trazodone.  Keep up with therapist as you are.  Discuss headaches w/ Neurology 12/2022  Please schedule a Follow-up Appointment to: Return in about 8 months (around 08/27/2023) for 7-8 months Annual Physical fasting lab after.  If you have any other questions or concerns, please feel free to call the office or send a message through MyChart. You may also schedule an earlier appointment if necessary.  Additionally, you may be receiving a survey about your experience at our office within a few days to 1 week by e-mail or mail. We value your feedback.  Saralyn Pilar, DO [facility], [place]

## 2022-12-26 NOTE — Addendum Note (Signed)
Addended by: Smitty Cords on: 12/26/2022 01:33 PM   Modules accepted: Level of Service

## 2022-12-26 NOTE — Progress Notes (Addendum)
Subjective:    Patient ID: YARIANNA VARBLE, female    DOB: [birth year], 52 y.o.   MRN: [id number]  Carmen Logan is a 52 y.o. female presenting on 12/26/2022 for Depression, Anxiety, and Migraine  Virtual / Telehealth Encounter - Video Visit via MyChart The purpose of this virtual visit is to provide medical care while limiting exposure to the novel coronavirus (COVID19) for both patient and office staff.  Consent was obtained for remote visit:  Yes.   Answered questions that patient had about telehealth interaction:  Yes.   I discussed the limitations, risks, security and privacy concerns of performing an evaluation and management service by video/telephone. I also discussed with the patient that there may be a patient responsible charge related to this service. The patient expressed understanding and agreed to proceed.  Patient Location: Home Provider Location: [facility] (Office)  Participants in virtual visit: - Patient: Carmen Logan - CMA: Burnell Blanks, CMA - Provider: Dr Althea Charon   HPI  Major depression recurrent active, Severe Insomnia Her mother, who was my patient, passed 06/2022 Major stressor for her with complicated bereavement affecting her  Last visit 11/14/22 - started on Lexapro 10mg  and she was setup with therapist.  Followed by therapist virtually Olegario Shearer MS LCSW She has had twice weekly sessions, already seen her 3 times. With good results so far.  She continues to take Lexapro 10mg  daily now 4-6 weeks later and doing much better. Feeling good on medication and tolerating it well.  She continues also on Trazodone 50mg  daily at bedtime. She sleeps on 3rd shift now has an abnormal schedule. She has plans to work on promotion upcoming to get back to 1st shift schedule  Migraines episodic vs Cervicogenic Headaches  Last visit 11/14/22 - Given sample Ubrelvy 1 pill, and referral to [facility] Neurology, apt in May 2024 She has  tried other meds before Triptan, Nurtec Takes Fioricet PRN She took this and it was helpful but unsure yet if it was successful She does have concern that it could be cervicogenic headache again maybe disc related. She is waiting for Neurologist in May 2024     12/26/2022    1:11 PM 11/14/2022    1:21 PM 08/02/2022    1:39 PM  Depression screen PHQ 2/9  Decreased Interest 1 1 0  Down, Depressed, Hopeless 0 2 1  PHQ - 2 Score 1 3 1   Altered sleeping 1 1 1   Tired, decreased energy 0 0 1  Change in appetite 0 0 1  Feeling bad or failure about yourself  0 3 0  Trouble concentrating 0 0 0  Moving slowly or fidgety/restless 0 0 0  Suicidal thoughts 0 1 0  PHQ-9 Score 2 8 4   Difficult doing work/chores  Not difficult at all Somewhat difficult      12/26/2022    1:13 PM 11/14/2022    1:22 PM 08/02/2022    1:39 PM 03/14/2021    8:57 AM  GAD 7 : Generalized Anxiety Score  Nervous, Anxious, on Edge 0 2 1 1   Control/stop worrying 0 2 0 1  Worry too much - different things 0 2 0 1  Trouble relaxing 1 1 1  0  Restless 0 0 0 0  Easily annoyed or irritable 1 0 1 0  Afraid - awful might happen 0 0 0 0  Total GAD 7 Score 2 7 3 3   Anxiety Difficulty  Not difficult at all Not  difficult at all Somewhat difficult     Social History   Tobacco Use   Smoking status: Former    Types: Cigarettes    Quit date: 2015    Years since quitting: 9.3   Smokeless tobacco: Never   Tobacco comments:    Unsure duration  Vaping Use   Vaping Use: Former   Quit date: 02/25/2014  Substance Use Topics   Alcohol use: Yes    Comment: occasional   Drug use: No    Review of Systems Per HPI unless specifically indicated above     Objective:    Ht 5\' 2"  (1.575 m)   Wt 100 lb (45.4 kg)   BMI 18.29 kg/m   Wt Readings from Last 3 Encounters:  12/26/22 100 lb (45.4 kg)  11/14/22 100 lb 6.4 oz (45.5 kg)  08/02/22 102 lb 3.2 oz (46.4 kg)    Physical Exam  Note examination was completely remotely via  video observation objective data only  Gen - well-appearing, no acute distress or apparent pain, comfortable HEENT - eyes appear clear without discharge or redness Heart/Lungs - cannot examine virtually - observed no evidence of coughing or labored breathing. Abd - cannot examine virtually  Skin - face visible today- no rash Neuro - awake, alert, oriented Psych - not anxious appearing. Mood is improved today. Good spirits, smiling.    Results for orders placed or performed in visit on 08/02/22  CBC with Differential/Platelet  Result Value Ref Range   WBC 5.5 3.4 - 10.8 x10E3/uL   RBC 4.57 3.77 - 5.28 x10E6/uL   Hemoglobin 13.8 11.1 - 15.9 g/dL   Hematocrit 40.9 81.1 - 46.6 %   MCV 90 79 - 97 fL   MCH 30.2 26.6 - 33.0 pg   MCHC 33.6 31.5 - 35.7 g/dL   RDW 91.4 78.2 - 95.6 %   Platelets 212 150 - 450 x10E3/uL   Neutrophils 60 Not Estab. %   Lymphs 32 Not Estab. %   Monocytes 6 Not Estab. %   Eos 1 Not Estab. %   Basos 1 Not Estab. %   Neutrophils Absolute 3.3 1.4 - 7.0 x10E3/uL   Lymphocytes Absolute 1.8 0.7 - 3.1 x10E3/uL   Monocytes Absolute 0.3 0.1 - 0.9 x10E3/uL   EOS (ABSOLUTE) 0.1 0.0 - 0.4 x10E3/uL   Basophils Absolute 0.0 0.0 - 0.2 x10E3/uL   Immature Granulocytes 0 Not Estab. %   Immature Grans (Abs) 0.0 0.0 - 0.1 x10E3/uL  Lipid panel  Result Value Ref Range   Cholesterol, Total 209 (H) 100 - 199 mg/dL   Triglycerides 93 0 - 149 mg/dL   HDL 74 >21 mg/dL   VLDL Cholesterol Cal 16 5 - 40 mg/dL   LDL Chol Calc (NIH) 308 (H) 0 - 99 mg/dL   Chol/HDL Ratio 2.8 0.0 - 4.4 ratio  Hemoglobin A1c  Result Value Ref Range   Hgb A1c MFr Bld 5.2 4.8 - 5.6 %   Est. average glucose Bld gHb Est-mCnc 103 mg/dL  TSH  Result Value Ref Range   TSH 2.940 0.450 - 4.500 uIU/mL  T4, free  Result Value Ref Range   Free T4 1.09 0.82 - 1.77 ng/dL  Comprehensive metabolic panel  Result Value Ref Range   Glucose 85 70 - 99 mg/dL   BUN 9 6 - 24 mg/dL   Creatinine, Ser 6.57 0.57 -  1.00 mg/dL   eGFR 81 >84 ON/GEX/5.28   BUN/Creatinine Ratio 10 9 - 23  Sodium 140 134 - 144 mmol/L   Potassium 4.1 3.5 - 5.2 mmol/L   Chloride 100 96 - 106 mmol/L   CO2 24 20 - 29 mmol/L   Calcium 9.4 8.7 - 10.2 mg/dL   Total Protein 7.3 6.0 - 8.5 g/dL   Albumin 4.6 3.9 - 4.9 g/dL   Globulin, Total 2.7 1.5 - 4.5 g/dL   Albumin/Globulin Ratio 1.7 1.2 - 2.2   Bilirubin Total 0.6 0.0 - 1.2 mg/dL   Alkaline Phosphatase 83 44 - 121 IU/L   AST 15 0 - 40 IU/L   ALT 15 0 - 32 IU/L      Assessment & Plan:   Problem List Items Addressed This Visit     Episodic migraine   GAD (generalized anxiety disorder)   Insomnia   Major depressive disorder, recurrent episode, in partial remission (HCC) - Primary    No orders of the defined types were placed in this encounter.  Significantly improved major depression, partial remission GAD Insomnia  Improved on SSRI Lexapro 10mg  daily 4-6 weeks later now Improved on CBT therapist virtually as listed above Continue Buspar, Trazodone  Episodic migraine headaches vs Cervicogenic headaches Chronic migraines and headaches, now discussion today we are questioning if there is Cervical spine DDD component, prior spine surgery 2015 w anterior cervical discectomy and fusion / plating, question if this seems to be contributing to posterior headaches that may be triggering or related to migraines   Failed Triptans, and Nurtec ODT recently   Sample Ubrelvy last time was helpful but did not resolve headache. She is unsure which med to try next  We discussed goal to f/u with Neurology as scheduled next. May 2024   She would be due for updated Neck imaging as well in future.   Note she is improved sometimes only on Fioricet, this would to me suggest maybe it is more than migraine   Follow up plan: Return in about 8 months (around 08/27/2023) for 7-8 months Annual Physical fasting lab after.  Patient verbalizes understanding with the above medical  recommendations including the limitation of remote medical advice.  Specific follow-up and call-back criteria were given for patient to follow-up or seek medical care more urgently if needed.  Total duration of direct patient care provided via video conference: 10 minutes    Saralyn Pilar, DO [facility] 12/26/2022, 1:20 PM

## 2023-01-16 ENCOUNTER — Ambulatory Visit: Payer: Managed Care, Other (non HMO) | Admitting: Neurology

## 2023-01-16 ENCOUNTER — Telehealth: Payer: Self-pay | Admitting: Neurology

## 2023-01-16 ENCOUNTER — Encounter: Payer: Self-pay | Admitting: Neurology

## 2023-01-16 VITALS — BP 99/65 | HR 57 | Ht 62.0 in | Wt 107.6 lb

## 2023-01-16 DIAGNOSIS — G43711 Chronic migraine without aura, intractable, with status migrainosus: Secondary | ICD-10-CM

## 2023-01-16 DIAGNOSIS — R29898 Other symptoms and signs involving the musculoskeletal system: Secondary | ICD-10-CM

## 2023-01-16 DIAGNOSIS — R519 Headache, unspecified: Secondary | ICD-10-CM

## 2023-01-16 DIAGNOSIS — R51 Headache with orthostatic component, not elsewhere classified: Secondary | ICD-10-CM

## 2023-01-16 DIAGNOSIS — Z79899 Other long term (current) drug therapy: Secondary | ICD-10-CM

## 2023-01-16 DIAGNOSIS — R200 Anesthesia of skin: Secondary | ICD-10-CM

## 2023-01-16 DIAGNOSIS — R270 Ataxia, unspecified: Secondary | ICD-10-CM

## 2023-01-16 DIAGNOSIS — G8929 Other chronic pain: Secondary | ICD-10-CM

## 2023-01-16 DIAGNOSIS — G4440 Drug-induced headache, not elsewhere classified, not intractable: Secondary | ICD-10-CM

## 2023-01-16 DIAGNOSIS — M5412 Radiculopathy, cervical region: Secondary | ICD-10-CM

## 2023-01-16 DIAGNOSIS — H547 Unspecified visual loss: Secondary | ICD-10-CM | POA: Diagnosis not present

## 2023-01-16 DIAGNOSIS — G43109 Migraine with aura, not intractable, without status migrainosus: Secondary | ICD-10-CM

## 2023-01-16 MED ORDER — METHYLPREDNISOLONE 4 MG PO TBPK: ORAL_TABLET | ORAL | 1 refills | Status: DC

## 2023-01-16 MED ORDER — AJOVY 225 MG/1.5ML ~~LOC~~ SOAJ
675.0000 mg | SUBCUTANEOUS | 11 refills | Status: DC
Start: 2023-01-16 — End: 2023-07-11

## 2023-01-16 MED ORDER — FREMANEZUMAB-VFRM 225 MG/1.5ML ~~LOC~~ SOSY
675.0000 mg | PREFILLED_SYRINGE | Freq: Once | SUBCUTANEOUS | Status: DC
Start: 2023-01-16 — End: 2023-07-11

## 2023-01-16 MED ORDER — ONDANSETRON 4 MG PO TBDP: 4.0000 mg | ORAL_TABLET | Freq: Three times a day (TID) | ORAL | 3 refills | Status: DC | PRN

## 2023-01-16 MED ORDER — RIZATRIPTAN BENZOATE 10 MG PO TBDP: 10.0000 mg | ORAL_TABLET | ORAL | 11 refills | Status: AC | PRN

## 2023-01-16 NOTE — Telephone Encounter (Signed)
sent to GI they obtain [employer] auth [phone] 

## 2023-01-16 NOTE — Patient Instructions (Addendum)
Inject 3 [name] every 3 months. Or if you get better, we can start [place] instead Insurance may want monthly emgality (then hold off may switch to [place]) Plan is when you come back your migraines will so improved(<8 migraines) we can use qulipta - before 3 months if yu improve mychart STOP using the OTC meds - MAX 10 days a month AT onset of headache: Stop imitrex. Rizatriptan and zofrn 4mg  then repeat in 2 hours Medrol dosepak for one week .  MRI brain and cervical spine.   Fremanezumab Injection What is this medication? FREMANEZUMAB (fre ma NEZ ue mab) prevents migraines. It works by blocking a substance in the body that causes migraines. It is a monoclonal antibody. This medicine may be used for other purposes; ask your health care provider or pharmacist if you have questions. COMMON BRAND NAME(S): [name] What should I tell my care team before I take this medication? They need to know if you have any of these conditions: An unusual or allergic reaction to fremanezumab, other medications, foods, dyes, or preservatives Pregnant or trying to get pregnant Breast-feeding How should I use this medication? This medication is injected under the skin. You will be taught how to prepare and give it. Take it as directed on the prescription label. Keep taking it unless your care team tells you to stop. It is important that you put your used needles and syringes in a special sharps container. Do not put them in a trash can. If you do not have a sharps container, call your pharmacist or care team to get one. Talk to your care team about the use of this medication in children. Special care may be needed. Overdosage: If you think you have taken too much of this medicine contact a poison control center or emergency room at once. NOTE: This medicine is only for you. Do not share this medicine with others. What if I miss a dose? If you miss a dose, take it as soon as you can. If it is almost time for your  next dose, take only that dose. Do not take double or extra doses. What may interact with this medication? Interactions are not expected. This list may not describe all possible interactions. Give your health care provider a list of all the medicines, herbs, non-prescription drugs, or dietary supplements you use. Also tell them if you smoke, drink alcohol, or use illegal drugs. Some items may interact with your medicine. What should I watch for while using this medication? Tell your care team if your symptoms do not start to get better or if they get worse. What side effects may I notice from receiving this medication? Side effects that you should report to your care team as soon as possible: Allergic reactions or angioedema--skin rash, itching or hives, swelling of the face, eyes, lips, tongue, arms, or legs, trouble swallowing or breathing Side effects that usually do not require medical attention (report to your care team if they continue or are bothersome): Pain, redness, or irritation at injection site This list may not describe all possible side effects. Call your doctor for medical advice about side effects. You may report side effects to FDA at 1-800-FDA-1088. Where should I keep my medication? Keep out of the reach of children and pets. Store in a refrigerator or at room temperature between 20 and 25 degrees C (68 and 77 degrees F). Refrigeration (preferred): Store in the refrigerator. Do not freeze. Keep in the original container until you are ready  to take it. Remove the dose from the carton about 30 minutes before it is time for you to use it. If the dose is not used, it may be stored in the original container at room temperature for 7 days. Get rid of any unused medication after the expiration date. Room Temperature: This medication may be stored at room temperature for up to 7 days. Keep it in the original container. Protect from light until time of use. If it is stored at room  temperature, get rid of any unused medication after 7 days or after it expires, whichever is first. To get rid of medications that are no longer needed or have expired: Take the medication to a medication take-back program. Check with your pharmacy or law enforcement to find a location. If you cannot return the medication, ask your pharmacist or care team how to get rid of this medication safely. NOTE: This sheet is a summary. It may not cover all possible information. If you have questions about this medicine, talk to your doctor, pharmacist, or health care provider.  2023 Elsevier/Gold Standard (2021-10-04 00:00:00)  Atogepant Tablets What is this medication? ATOGEPANT (a TOE je pant) prevents migraines. It works by blocking a substance in the body that causes migraines. This medicine may be used for other purposes; ask your health care provider or pharmacist if you have questions. COMMON BRAND NAME(S): QULIPTA What should I tell my care team before I take this medication? They need to know if you have any of these conditions: Kidney disease Liver disease An unusual or allergic reaction to atogepant, other medications, foods, dyes, or preservatives Pregnant or trying to get pregnant Breast-feeding How should I use this medication? Take this medication by mouth with water. Take it as directed on the prescription label at the same time every day. You can take it with or without food. If it upsets your stomach, take it with food. Keep taking it unless your care team tells you to stop. Talk to your care team about the use of this medication in children. Special care may be needed. Overdosage: If you think you have taken too much of this medicine contact a poison control center or emergency room at once. NOTE: This medicine is only for you. Do not share this medicine with others. What if I miss a dose? If you miss a dose, take it as soon as you can. If it is almost time for your next dose,  take only that dose. Do not take double or extra doses. What may interact with this medication? Carbamazepine Certain medications for fungal infections, such as itraconazole, ketoconazole Clarithromycin Cyclosporine Efavirenz Etravirine Phenytoin Rifampin St. John's wort This list may not describe all possible interactions. Give your health care provider a list of all the medicines, herbs, non-prescription drugs, or dietary supplements you use. Also tell them if you smoke, drink alcohol, or use illegal drugs. Some items may interact with your medicine. What should I watch for while using this medication? Visit your care team for regular checks on your progress. Tell your care team if your symptoms do not start to get better or if they get worse. What side effects may I notice from receiving this medication? Side effects that you should report to your care team as soon as possible: Allergic reactions--skin rash, itching, hives, swelling of the face, lips, tongue, or throat Side effects that usually do not require medical attention (report to your care team if they continue or are bothersome): Constipation  Fatigue Loss of appetite with weight loss Nausea This list may not describe all possible side effects. Call your doctor for medical advice about side effects. You may report side effects to FDA at 1-800-FDA-1088. Where should I keep my medication? Keep out of the reach of children and pets. Store at room temperature between 20 and 25 degrees C (68 and 77 degrees F). Get rid of any unused medication after the expiration date. To get rid of medications that are no longer needed or have expired: Take the medication to a medication take-back program. Check with your pharmacy or law enforcement to find a location. If you cannot return the medication, check the label or package insert to see if the medication should be thrown out in the garbage or flushed down the toilet. If you are not sure,  ask your care team. If it is safe to put it in the trash, take the medication out of the container. Mix the medication with cat litter, dirt, coffee grounds, or other unwanted substance. Seal the mixture in a bag or container. Put it in the trash. NOTE: This sheet is a summary. It may not cover all possible information. If you have questions about this medicine, talk to your doctor, pharmacist, or health care provider.  2023 Elsevier/Gold Standard (2021-08-30 00:00:00) Ondansetron Injection What is this medication? ONDANSETRON (on DAN se tron) prevents nausea and vomiting from chemotherapy, radiation, or surgery. It works by blocking substances in the body that may cause nausea or vomiting. It belongs to a class of medications called antiemetics. This medicine may be used for other purposes; ask your health care provider or pharmacist if you have questions. COMMON BRAND NAME(S): Zofran, Zofran in Dextrose, Zofran Solution What should I tell my care team before I take this medication? They need to know if you have any of these conditions: Heart disease History of irregular heartbeat Liver disease Low levels of magnesium or potassium in the blood An unusual or allergic reaction to ondansetron, granisetron, other medications, foods, dyes, or preservatives Pregnant or trying to get pregnant Breast-feeding How should I use this medication? This medication is injected into a vein. It is given by your care team in a hospital or clinic setting. Talk to your care team about the use of this medication in children. Special care may be needed. Overdosage: If you think you have taken too much of this medicine contact a poison control center or emergency room at once. NOTE: This medicine is only for you. Do not share this medicine with others. What if I miss a dose? This does not apply. What may interact with this medication? Do not take this medication with any of the following: Apomorphine Certain  medications for fungal infections, such as fluconazole, itraconazole, ketoconazole, posaconazole, voriconazole Cisapride Dronedarone Pimozide Thioridazine This medication may also interact with the following: Carbamazepine Certain medications for depression, anxiety, or mental health conditions Fentanyl Linezolid MAOIs, such as Carbex, Eldepryl, Marplan, Nardil, and Parnate Methylene blue (injected into a vein) Other medications that cause heart rhythm changes, such as dofetilide, ziprasidone Phenytoin Rifampicin Tramadol This list may not describe all possible interactions. Give your health care provider a list of all the medicines, herbs, non-prescription drugs, or dietary supplements you use. Also tell them if you smoke, drink alcohol, or use illegal drugs. Some items may interact with your medicine. What should I watch for while using this medication? Your condition will be monitored carefully while you are receiving this medication. What side effects may  I notice from receiving this medication? Side effects that you should report to your care team as soon as possible: Allergic reactions--skin rash, itching, hives, swelling of the face, lips, tongue, or throat Bowel blockage--stomach cramping, unable to have a bowel movement or pass gas, loss of appetite, vomiting Chest pain (angina)--pain, pressure, or tightness in the chest, neck, back, or arms Heart rhythm changes--fast or irregular heartbeat, dizziness, feeling faint or lightheaded, chest pain, trouble breathing Irritability, confusion, fast or irregular heartbeat, muscle stiffness, twitching muscles, sweating, high fever, seizure, chills, vomiting, diarrhea, which may be signs of serotonin syndrome Side effects that usually do not require medical attention (report to your care team if they continue or are bothersome): Constipation Diarrhea General discomfort and fatigue Headache This list may not describe all possible side  effects. Call your doctor for medical advice about side effects. You may report side effects to FDA at 1-800-FDA-1088. Where should I keep my medication? This medication is given in a hospital or clinic and will not be stored at home. NOTE: This sheet is a summary. It may not cover all possible information. If you have questions about this medicine, talk to your doctor, pharmacist, or health care provider.  2023 Elsevier/Gold Standard (2022-01-12 00:00:00) Rizatriptan Disintegrating Tablets What is this medication? RIZATRIPTAN (rye za TRIP tan) treats migraines. It works by blocking pain signals and narrowing blood vessels in the brain. It belongs to a group of medications called triptans. It is not used to prevent migraines. This medicine may be used for other purposes; ask your health care provider or pharmacist if you have questions. COMMON BRAND NAME(S): Maxalt-MLT What should I tell my care team before I take this medication? They need to know if you have any of these conditions: Circulation problems in fingers and toes Diabetes Heart disease High blood pressure High cholesterol History of irregular heartbeat History of stroke Stomach or intestine problems Tobacco use An unusual or allergic reaction to rizatriptan, other medications, foods, dyes, or preservatives Pregnant or trying to get pregnant Breast-feeding How should I use this medication? Take this medication by mouth. Take it as directed on the prescription label. You do not need water to take this medication. Leave the tablet in the sealed pack until you are ready to take it. With dry hands, open the pack and gently remove the tablet. If the tablet breaks or crumbles, throw it away. Use a new tablet. Place the tablet on the tongue and allow it to dissolve. Then, swallow it. Do not cut, crush, or chew this medication. Do not use it more often than directed. Talk to your care team about the use of this medication in children.  While it may be prescribed for children as young as 6 years for selected conditions, precautions do apply. Overdosage: If you think you have taken too much of this medicine contact a poison control center or emergency room at once. NOTE: This medicine is only for you. Do not share this medicine with others. What if I miss a dose? This does not apply. This medication is not for regular use. What may interact with this medication? Do not take this medication with any of the following: Ergot alkaloids, such as dihydroergotamine, ergotamine MAOIs, such as Marplan, Nardil, Parnate Other medications for migraine headache, such as almotriptan, eletriptan, frovatriptan, naratriptan, sumatriptan, zolmitriptan This medication may also interact with the following: Certain medications for depression, anxiety, or other mental health conditions Propranolol This list may not describe all possible interactions. Give your  health care provider a list of all the medicines, herbs, non-prescription drugs, or dietary supplements you use. Also tell them if you smoke, drink alcohol, or use illegal drugs. Some items may interact with your medicine. What should I watch for while using this medication? Visit your care team for regular checks on your progress. Tell your care team if your symptoms do not start to get better or if they get worse. This medication may affect your coordination, reaction time, or judgment. Do not drive or operate machinery until you know how this medication affects you. Sit up or stand slowly to reduce the risk of dizzy or fainting spells. If you take migraine medications for 10 or more days a month, your migraines may get worse. Keep a diary of headache days and medication use. Contact your care team if your migraine attacks occur more frequently. What side effects may I notice from receiving this medication? Side effects that you should report to your care team as soon as possible: Allergic  reactions--skin rash, itching, hives, swelling of the face, lips, tongue, or throat Burning, pain, tingling, or color changes in the hands, arms, legs, or feet Heart attack--pain or tightness in the chest, shoulders, arms, or jaw, nausea, shortness of breath, cold or clammy skin, feeling faint or lightheaded Heart rhythm changes--fast or irregular heartbeat, dizziness, feeling faint or lightheaded, chest pain, trouble breathing Increase in blood pressure Irritability, confusion, fast or irregular heartbeat, muscle stiffness, twitching muscles, sweating, high fever, seizure, chills, vomiting, diarrhea, which may be signs of serotonin syndrome Raynaud syndrome--cool, numb, or painful fingers or toes that may change color from pale, to blue, to red Seizures Stroke--sudden numbness or weakness of the face, arm, or leg, trouble speaking, confusion, trouble walking, loss of balance or coordination, dizziness, severe headache, change in vision Sudden or severe stomach pain, bloody diarrhea, fever, nausea, vomiting Vision loss Side effects that usually do not require medical attention (report to your care team if they continue or are bothersome): Dizziness Unusual weakness or fatigue This list may not describe all possible side effects. Call your doctor for medical advice about side effects. You may report side effects to FDA at 1-800-FDA-1088. Where should I keep my medication? Keep out of the reach of children and pets. Store at room temperature between 15 and 30 degrees C (59 and 86 degrees F). Protect from light and moisture. Get rid of any unused medication after the expiration date. To get rid of medications that are no longer needed or have expired: Take the medication to a medication take-back program. Check with your pharmacy or law enforcement to find a location. If you cannot return the medication, check the label or package insert to see if the medication should be thrown out in the garbage  or flushed down the toilet. If you are not sure, ask your care team. If it is safe to put it in the trash, empty the medication out of the container. Mix the medication with cat litter, dirt, coffee grounds, or other unwanted substance. Seal the mixture in a bag or container. Put it in the trash. NOTE: This sheet is a summary. It may not cover all possible information. If you have questions about this medicine, talk to your doctor, pharmacist, or health care provider.  2023 Elsevier/Gold Standard (2021-12-15 00:00:00) Methylprednisolone Tablets What is this medication? METHYLPREDNISOLONE (meth ill pred NISS oh lone) treats many conditions such as asthma, allergic reactions, arthritis, inflammatory bowel diseases, adrenal, and blood or bone marrow disorders.  It works by decreasing inflammation, slowing down an overactive immune system, or replacing cortisol normally made in the body. Cortisol is a hormone that plays an important role in how the body responds to stress, illness, and injury. It belongs to a group of medications called steroids. This medicine may be used for other purposes; ask your health care provider or pharmacist if you have questions. COMMON BRAND NAME(S): Medrol, Medrol Dosepak What should I tell my care team before I take this medication? They need to know if you have any of these conditions: Cushing's syndrome Eye disease, vision problems Diabetes Glaucoma Heart disease High blood pressure Infection especially a viral infection, such as chickenpox, cold sores, or herpes Liver disease Mental health conditions Myasthenia gravis Osteoporosis Recent or upcoming vaccine Seizures Stomach or intestine problems Thyroid disease An unusual or allergic reaction to lactose, methylprednisolone, other medications, foods, dyes, or preservatives Pregnant or trying to get pregnant Breastfeeding How should I use this medication? Take this medication by mouth with a glass of water.  Follow the directions on the prescription label. Take this medication with food. If you are taking this medication once a day, take it in the morning. Do not take it more often than directed. Do not suddenly stop taking your medication because you may develop a severe reaction. Your care team will tell you how much medication to take. If your care team wants you to stop the medication, the dose may be slowly lowered over time to avoid any side effects. Talk to your care team about the use of this medication in children. Special care may be needed. Overdosage: If you think you have taken too much of this medicine contact a poison control center or emergency room at once. NOTE: This medicine is only for you. Do not share this medicine with others. What if I miss a dose? If you miss a dose, take it as soon as you can. If it is almost time for your next dose, talk to your care team. You may need to miss a dose or take an extra dose. Do not take double or extra doses without advice. What may interact with this medication? Do not take this medication with any of the following: Alefacept Echinacea Live virus vaccines Metyrapone Mifepristone This medication may also interact with the following: Amphotericin B Aspirin and aspirin-like medications Certain antibiotics, such as erythromycin, clarithromycin, troleandomycin Certain medications for diabetes Certain medications for fungal infections, such as ketoconazole Certain medications for seizures, such as carbamazepine, phenobarbital, phenytoin Certain medications that treat or prevent blood clots, such as warfarin Cholestyramine Cyclosporine Digoxin Diuretics Estrogen or progestin hormones Isoniazid NSAIDs, medications for pain and inflammation, such as ibuprofen or naproxen Other medications for myasthenia gravis Rifampin Vaccines This list may not describe all possible interactions. Give your health care provider a list of all the medicines,  herbs, non-prescription drugs, or dietary supplements you use. Also tell them if you smoke, drink alcohol, or use illegal drugs. Some items may interact with your medicine. What should I watch for while using this medication? Tell your care team if your symptoms do not start to get better or if they get worse. Do not stop taking except on your care team's advice. You may develop a severe reaction. Your care team will tell you how much medication to take. This medication may increase your risk of getting an infection. Tell your care team if you are around anyone with measles or chickenpox, or if you develop sores or blisters  that do not heal properly. This medication may increase blood sugar levels. Ask your care team if changes in diet or medications are needed if you have diabetes. Tell your care team right away if you have any change in your eyesight. Using this medication for a long time may increase your risk of low bone mass. Talk to your care team about bone health. What side effects may I notice from receiving this medication? Side effects that you should report to your care team as soon as possible: Allergic reactions--skin rash, itching, hives, swelling of the face, lips, tongue, or throat Cushing syndrome--increased fat around the midsection, upper back, neck, or face, pink or purple stretch marks on the skin, thinning, fragile skin that easily bruises, unexpected hair growth High blood sugar (hyperglycemia)--increased thirst or amount of urine, unusual weakness or fatigue, blurry vision Increase in blood pressure Infection--fever, chills, cough, sore throat, wounds that don't heal, pain or trouble when passing urine, general feeling of discomfort or being unwell Low adrenal gland function--nausea, vomiting, loss of appetite, unusual weakness or fatigue, dizziness Mood and behavior changes--anxiety, nervousness, confusion, hallucinations, irritability, hostility, thoughts of suicide or  self-harm, worsening mood, feelings of depression Stomach bleeding--bloody or black, tar-like stools, vomiting blood or brown material that looks like coffee grounds Swelling of the ankles, hands, or feet Side effects that usually do not require medical attention (report to your care team if they continue or are bothersome): Acne General discomfort and fatigue Headache Increase in appetite Nausea Trouble sleeping Weight gain This list may not describe all possible side effects. Call your doctor for medical advice about side effects. You may report side effects to FDA at 1-800-FDA-1088. Where should I keep my medication? Keep out of the reach of children and pets. Store at room temperature between 20 and 25 degrees C (68 and 77 degrees F). Throw away any unused medication after the expiration date. NOTE: This sheet is a summary. It may not cover all possible information. If you have questions about this medicine, talk to your doctor, pharmacist, or health care provider.  2023 Elsevier/Gold Standard (2020-10-18 00:00:00)

## 2023-01-16 NOTE — Progress Notes (Signed)
[facility]    Provider:  Dr Lucia Gaskins Requesting Provider: Saralyn Pilar * Primary Care Provider:  Smitty Cords, DO  CC:  migraine  HPI:  Carmen Logan is a 52 y.o. female here as requested by Saralyn Pilar * for migraine. has Surgical menopause; HPV test positive; Status post vaginal hysterectomy; History of endometriosis; GAD (generalized anxiety disorder); Major depressive disorder, recurrent episode, in partial remission (HCC); Episodic migraine; Shift work sleep disorder; Insomnia; RUQ abdominal pain; Gastric atrophy; Screen for colon cancer; Cervicogenic headache; Chronic migraine without aura, with intractable migraine, so stated, with status migrainosus; and Medication overuse headache on their problem list.   Headaches and migraines daily. Wakes up every day with a headache. Doesn't know if snores. Not "abnormally tired" during the day, wakes up refreshed. She is taking excedrin ibuprofen and tylenol everyday.: medication overuse/rebound headache and warned of the side effects of bleeding or organ damage.(Gastritis). She will have headaches may turn into a migraines, migrianes start with loss of visiom splotchy. Throbbing/pulsating,pounding/stabbing, moving head make it worse, nausea, photo/phononphobia,vomiting, going into a dark and quiet room helps and a cold wrap, unknown triggers, weather will trigger. Started a a child, Fhx of migraines.  Numbness in the left arm, painful, radiating, imbalance, radiates to thumb and pointer, always from the neck. Can elicit with certain neck movement, failed conservative measures including OTC, heat, stretching, yoga > 3 months, falls, been under the care of doctors for 2 years, progressive  Reviewed notes, labs and imaging from outside physicians, which showed :  Medications tried: imitrex, fioricet, tylenol, excedrin, ibuprofen, nurect,ubrelvy, buspirone, amitriptyline and nortriptyline are  contraindicated because she is already on serotonergic drugs and may cause serotonin syndrome, aimovig contraindicated due to constipation, blood pressure medications are contraindicated due to hypotension and bradycardia, Lexapro, Decadron, gabapentin, Zofran, effexor, maxalt, trazodone, venlafaxine, topiramate in the past side effects mental fogginess  Tsh nml 07/2022    Latest Ref Rng & Units 08/08/2022   10:21 AM 03/15/2021   10:47 AM 10/20/2020    9:50 AM  CMP  Glucose 70 - 99 mg/dL 85  88  811   BUN 6 - 24 mg/dL 9  8  8    Creatinine 0.57 - 1.00 mg/dL 9.14  7.82  9.56   Sodium 134 - 144 mmol/L 140  140  142   Potassium 3.5 - 5.2 mmol/L 4.1  4.0  4.0   Chloride 96 - 106 mmol/L 100  101  103   CO2 20 - 29 mmol/L 24  26  22    Calcium 8.7 - 10.2 mg/dL 9.4  9.2  9.1   Total Protein 6.0 - 8.5 g/dL 7.3  6.8  7.0   Total Bilirubin 0.0 - 1.2 mg/dL 0.6  0.4  0.4   Alkaline Phos 44 - 121 IU/L 83  74  66   AST 0 - 40 IU/L 15  10  15    ALT 0 - 32 IU/L 15  8  10        Latest Ref Rng & Units 08/08/2022   10:21 AM 03/15/2021   10:47 AM 10/20/2020    9:50 AM  CBC  WBC 3.4 - 10.8 x10E3/uL 5.5  5.9  7.4   Hemoglobin 11.1 - 15.9 g/dL 21.3  08.6  57.8   Hematocrit 34.0 - 46.6 % 41.1  37.3  38.6   Platelets 150 - 450 x10E3/uL 212  197  214    IMAGING PRIOR TO CERVICAL DISKECTOMY  Thoracic 2015: FINDINGS:  The vertebral bodies are normally aligned. The thoracic vertebral body heights and disc spaces are well preserved. There is a Schmorl's node within the superior endplate of L1.  Small foci of increased T1 and T2 signal are noted in T5 and T7, likely benign hemangiomas.  The signal intensity from the spinal cord is normal.  At T10-11, there is a shallow right paracentral disc protrusion which minimally indents the ventral thecal sac. There is no significant spinal canal or neural foraminal stenosis. The conus medullaris ends at a normal level.  IMPRESSION: No spinal canal stenosis or abnormal  spinal cord signal. Small left paracentral disc protrusion at T10-11. Other Result Text  Interface, Rad Results In - 01/30/2014 10:25 AM EDT EXAM INFO: [id number] UN 01/30/14  09:28:[id number] [facility]) : MRI THORACIC SPINE WO CONTRAST  EXAM: Magnetic resonance imaging, spinal canal and contents, thoracic, without contrast material.  DICTATED: 01/30/14 09:30:38  Interpretation location: Main campus.  CLINICAL INDICATION: 52 year old F.  719.41 - Shoulder pain, right.  COMPARISON: None available.  TECHNIQUE: Multiplanar MRI was performed through the thoracic spine without contrast administration.  FINDINGS: The vertebral bodies are normally aligned. The thoracic vertebral body heights and disc spaces are well preserved. There is a Schmorl's node within the superior endplate of L1.  Small foci of increased T1 and T2 signal are noted in T5 and T7, likely benign hemangiomas.  The signal intensity from the spinal cord is normal.  At T10-11, there is a shallow right paracentral disc protrusion which minimally indents the ventral thecal sac. There is no significant spinal canal or neural foraminal stenosis. The conus medullaris ends at a normal level.  IMPRESSION: No spinal canal stenosis or abnormal spinal cord signal. Small left paracentral disc protrusion at T10-11.  Cervical 2015: DICTATED: 01/30/14 09:28:53   CLINICAL INDICATION: 52 year-old F with history of severe right shoulder pain with numbness and tingling in the right upper extremity.   TECHNIQUE: Multiplanar MRI was performed through the cervical spine without contrast administration.   COMPARISON: None   FINDINGS: There is reversal of the normal cervical lordosis.  The signal intensity from the vertebral body bone marrow and spinal cord is normal.    C2-C3: There is no significant spinal canal or neural foraminal stenosis.   C3-C4: There is some disc osteophyte complex without significant spinal canal or neural foraminal  stenosis.   C4-C5: There is a disc osteophyte complex without significant spinal canal or neural foraminal stenosis.   C5-C6: There is disc osteophyte complex causing moderate bilateral neural foraminal stenosis. There is mild spinal canal stenosis.   C6-C7: There is disc osteophyte complex with a central disc protrusion causing mild spinal canal stenosis. There is no significant neural foraminal stenosis.   C7-T1: There is no significant spinal canal or neural foraminal stenosis.   INTERPRETATION LOCATION:  Main [facility]   IMPRESSION:   Degenerative changes as described above. There is mild canal stenosis and moderate bilateral neural foraminal stenosis at C5-C6. At C6-C7 there is a central disc protrusion causing mild spinal canal stenosis.    Review of Systems: Patient complains of symptoms per HPI as well as the following symptoms migraine. Pertinent negatives and positives per HPI. All others negative.   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  Tobacco Use   Smoking status: Former    Types: Cigarettes    Quit date: 2015    Years since quitting: 9.3   Smokeless tobacco: Never   Tobacco comments:    Unsure duration    Pt vapes daily   Vaping Use   Vaping Use: Former   Quit date: 02/25/2014  Substance and Sexual Activity   Alcohol use: Yes    Comment: occasional   Drug use: No   Sexual activity: Yes    Birth control/protection: Surgical  Other Topics Concern   Not on file  Social History Narrative   Not on file   Social Determinants of Health   Financial Resource Strain: Not on file  Food Insecurity: Not on file  Transportation Needs: Not on file  Physical Activity: Insufficiently Active (01/24/2018)   Exercise Vital Sign     Days of Exercise per Week: 1 day    Minutes of Exercise per Session: 30 min  Stress: Not on file  Social Connections: Not on file  Intimate Partner Violence: Not on file    Family History  Problem Relation Age of Onset   Heart disease Mother    Hypertension Mother    Thyroid disease Mother    Migraines Mother    Heart disease Father    Heart attack Father 65   Depression Sister    Diabetes Maternal Aunt    Migraines Maternal Aunt    Diabetes Maternal Grandmother    Cancer Neg Hx    Breast cancer Neg Hx     Past Medical History:  Diagnosis Date   Anxiety    Cervical dysplasia    Depression    Dyspareunia, female    Endometriosis    Facial skin lesion    HPV test positive    Migraine    "seems weather related"   Surgical menopause    Wears contact lenses    sometimes    Patient Active Problem List   Diagnosis Date Noted   Chronic migraine without aura, with intractable migraine, so stated, with status migrainosus 01/16/2023   Medication overuse headache 01/16/2023   Cervicogenic headache 11/14/2022   RUQ abdominal pain    Gastric atrophy    Screen for colon cancer    Shift work sleep disorder 03/08/2020   Insomnia 03/08/2020   GAD (generalized anxiety disorder) 09/02/2018   Major depressive disorder, recurrent episode, in partial remission (HCC) 09/02/2018   Episodic migraine 09/02/2018   Surgical menopause 08/04/2015   HPV test positive 08/04/2015   Status post vaginal hysterectomy 08/04/2015   History of endometriosis 08/04/2015    Past Surgical History:  Procedure Laterality Date   BIOPSY N/A 11/16/2020   Procedure: BIOPSY;  Surgeon: Pasty Spillers, MD;  Location: [facility];  Service: Endoscopy;  Laterality: N/A;   CERVICAL DISCECTOMY  03/17/2014   w/ fusion and plating   COLONOSCOPY WITH PROPOFOL N/A 11/16/2020   Procedure: COLONOSCOPY WITH PROPOFOL;  Surgeon: Pasty Spillers, MD;  Location: [facility];  Service:  Endoscopy;  Laterality: N/A;   CRYOTHERAPY     cervix   ESOPHAGOGASTRODUODENOSCOPY N/A 11/16/2020   Procedure: ESOPHAGOGASTRODUODENOSCOPY (EGD);  Surgeon: Pasty Spillers, MD;  Location: [facility];  Service: Endoscopy;  Laterality: N/A;   TUBAL LIGATION  1999   VAGINAL HYSTERECTOMY  10/2009   Total vaginal hysterectomy, lso    Current Outpatient Medications  Medication Sig Dispense Refill  acetaminophen (TYLENOL) 500 MG chewable tablet Chew 500 mg by mouth every 6 (six) hours as needed for pain.     busPIRone (BUSPAR) 7.5 MG tablet TAKE 1 TABLET(7.5 MG) BY MOUTH TWICE DAILY AS NEEDED 180 tablet 0   butalbital-apap-caffeine-codeine (FIORICET WITH CODEINE) 50-325-40-30 MG capsule TAKE 1 TO 2 CAPSULES BY MOUTH EVERY 4 HOURS AS NEEDED FOR HEADACHE OR MIGRAINE. NO MORE THAN 6 CAPSULES DAILY 30 capsule 0   escitalopram (LEXAPRO) 10 MG tablet Take 1 tablet (10 mg total) by mouth daily. 90 tablet 1   Fremanezumab-vfrm ([name]) 225 MG/1.5ML SOAJ Inject 675 mg into the skin every 3 (three) months. 4.5 mL 11   methylPREDNISolone (MEDROL DOSEPAK) 4 MG TBPK tablet Take pills daily all together with food. Take the first dose (6 pills) as soon as possible. Take the rest each morning. For 6 days total 6-5-4-3-2-1. 21 tablet 1   Multiple Vitamin (MULTIVITAMIN WITH MINERALS) TABS tablet Take 1 tablet by mouth daily.     omeprazole (PRILOSEC) 20 MG capsule Take 1 capsule (20 mg total) by mouth daily. (Patient taking differently: Take 20 mg by mouth daily as needed (acid reflux).) 30 capsule 2   ondansetron (ZOFRAN-ODT) 4 MG disintegrating tablet Take 1-2 tablets (4-8 mg total) by mouth every 8 (eight) hours as needed. 30 tablet 3   rizatriptan (MAXALT-MLT) 10 MG disintegrating tablet Take 1 tablet (10 mg total) by mouth as needed for migraine. May repeat in 2 hours if needed 9 tablet 11   traZODone (DESYREL) 50 MG tablet Take 1 tablet (50 mg total) by mouth at bedtime. 90 tablet 1   Current  Facility-Administered Medications  Medication Dose Route Frequency Provider Last Rate Last Admin   Fremanezumab-vfrm SOSY 675 mg  675 mg Subcutaneous Once Anson Fret, MD        Allergies as of 01/16/2023   (No Known Allergies)    Vitals: BP 99/65   Pulse (!) 57   Ht 5\' 2"  (1.575 m)   Wt 107 lb 9.6 oz (48.8 kg)   BMI 19.68 kg/m  Last Weight:  Wt Readings from Last 1 Encounters:  01/16/23 107 lb 9.6 oz (48.8 kg)   Last Height:   Ht Readings from Last 1 Encounters:  01/16/23 5\' 2"  (1.575 m)     Physical exam: Exam: Gen: NAD, conversant, well nourised, well groomed                     CV: RRR, no MRG. No Carotid Bruits. No peripheral edema, warm, nontender Eyes: Conjunctivae clear without exudates or hemorrhage  Neuro: Detailed Neurologic Exam  Speech:    Speech is normal; fluent and spontaneous with normal comprehension.  Cognition:    The patient is oriented to person, place, and time;     recent and remote memory intact;     language fluent;     normal attention, concentration,     fund of knowledge Cranial Nerves:    The pupils are equal, round, and reactive to light. The fundi are normal and spontaneous venous pulsations are present. Visual fields are full to finger confrontation. Extraocular movements are intact. Trigeminal sensation is intact and the muscles of mastication are normal. The face is symmetric. The palate elevates in the midline. Hearing intact. Voice is normal. Shoulder shrug is normal. The tongue has normal motion without fasciculations.   Coordination:    Normal finger to nose and heel to shin. Normal rapid alternating movements.   Gait:  left triceps weak    Heel-toe and tandem gait are normal.   Motor Observation:    No asymmetry, no atrophy, and no involuntary movements noted. Tone:    Normal muscle tone.    Posture:    Posture is normal. normal erect    Strength:    Strength is V/V in the upper and lower limbs.      Sensation:  intact to LT     Reflex Exam:  DTR's:    Deep tendon reflexes in the upper and lower extremities are normal bilaterally.   Toes:    The toes are downgoing bilaterally.   Clonus:    Clonus is absent.    Assessment/Plan:  Patient with chronic migraine aggravated by medication overuse;  Also Numbness in the left arm, painful, radiating, imbalance, radiates to thumb and pointer, always from the neck. Can elicit with certain neck movement, failed conservative measures including OTC, heat, stretching, yoga > 3 months, falls, been under the care of doctors for 2 years, progressive  She is taking excedrin ibuprofen and tylenol everyday.: medication overuse/rebound headache and warned of the side effects of bleeding or organ damage.(Gastritis)  Inject 3 [name] every 3 months. Or if you get better, we can start [place] instead Insurance may want monthly emgality (then hold off may switch to [place]) Plan is when you come back your migraines will so improved(<8 migraines) we can use qulipta - before 3 months if yu improve mychart STOP using the OTC meds - MAX 10 days a month AT onset of headache: Stop imitrex. Rizatriptan and zofrn 4mg  then repeat in 2 hours Medrol dosepak for one week .  MRI brain: MRI brain due to concerning symptoms of morning headaches, positional headaches,vision changes, worsening headaches  to look for space occupying mass, chiari or intracranial hypertension (pseudotumor), strokes, malignancies, vasculidities, demyelination(multiple sclerosis) or other MRI cervical spine: Numbness in the left arm, painful, radiating, imbalance, radiates to thumb and pointer, always from the neck. Can elicit with certain neck movement, failed conservative measures including OTC, heat, stretching, yoga > 3 months, falls, been under the care of doctors for 2 years, progressive  Orders Placed This Encounter  Procedures   MR BRAIN W WO CONTRAST   MR CERVICAL SPINE WO CONTRAST   Basic Metabolic  Panel   Meds ordered this encounter  Medications   Fremanezumab-vfrm SOSY 675 mg   rizatriptan (MAXALT-MLT) 10 MG disintegrating tablet    Sig: Take 1 tablet (10 mg total) by mouth as needed for migraine. May repeat in 2 hours if needed    Dispense:  9 tablet    Refill:  11   ondansetron (ZOFRAN-ODT) 4 MG disintegrating tablet    Sig: Take 1-2 tablets (4-8 mg total) by mouth every 8 (eight) hours as needed.    Dispense:  30 tablet    Refill:  3   methylPREDNISolone (MEDROL DOSEPAK) 4 MG TBPK tablet    Sig: Take pills daily all together with food. Take the first dose (6 pills) as soon as possible. Take the rest each morning. For 6 days total 6-5-4-3-2-1.    Dispense:  21 tablet    Refill:  1   Fremanezumab-vfrm ([name]) 225 MG/1.5ML SOAJ    Sig: Inject 675 mg into the skin every 3 (three) months.    Dispense:  4.5 mL    Refill:  11    Cc: Karamalegos, Alexander *,  Althea Charon, Netta Neat, DO  Naomie Dean, MD  [facility] [phone]  [address] [place], [place] [address]  Phone [phone] Fax [phone]  I spent over 80 minutes of face-to-face and non-face-to-face time with patient on the  1. Morning headache   2. Positional headache   3. Loss of vision   4. Chronic intractable headache, unspecified headache type   5. Chronic migraine without aura, with intractable migraine, so stated, with status migrainosus   6. Rebound effect of medication   7. Medication overuse headache   8. Migraine with aura and without status migrainosus, not intractable   9. Left arm numbness   10. Left arm weakness   11. Cervical radiculopathy   12. Ataxia    diagnosis.  This included previsit chart review, lab review, study review, order entry, electronic health record documentation, patient education on the different diagnostic and therapeutic options, counseling and coordination of care, risks and benefits of management, compliance, or risk factor reduction'

## 2023-01-17 LAB — BASIC METABOLIC PANEL
BUN/Creatinine Ratio: 9 (ref 9–23)
BUN: 8 mg/dL (ref 6–24)
CO2: 29 mmol/L (ref 20–29)
Calcium: 8.9 mg/dL (ref 8.7–10.2)
Chloride: 100 mmol/L (ref 96–106)
Creatinine, Ser: 0.85 mg/dL (ref 0.57–1.00)
Glucose: 90 mg/dL (ref 70–99)
Potassium: 4.5 mmol/L (ref 3.5–5.2)
Sodium: 140 mmol/L (ref 134–144)
eGFR: 84 mL/min/{1.73_m2} (ref >59)

## 2023-01-24 ENCOUNTER — Ambulatory Visit: Payer: Managed Care, Other (non HMO) | Admitting: Neurology

## 2023-01-30 ENCOUNTER — Ambulatory Visit: Payer: Managed Care, Other (non HMO) | Admitting: Neurology

## 2023-02-27 ENCOUNTER — Ambulatory Visit
Admission: RE | Admit: 2023-02-27 | Discharge: 2023-02-27 | Disposition: A | Discharge status: Home / Self Care | Payer: Managed Care, Other (non HMO) | Source: Ambulatory Visit | Attending: Neurology | Admitting: Neurology

## 2023-02-27 DIAGNOSIS — R270 Ataxia, unspecified: Secondary | ICD-10-CM

## 2023-02-27 DIAGNOSIS — R51 Headache with orthostatic component, not elsewhere classified: Secondary | ICD-10-CM

## 2023-02-27 DIAGNOSIS — R200 Anesthesia of skin: Secondary | ICD-10-CM

## 2023-02-27 DIAGNOSIS — H547 Unspecified visual loss: Secondary | ICD-10-CM

## 2023-02-27 DIAGNOSIS — M5412 Radiculopathy, cervical region: Secondary | ICD-10-CM

## 2023-02-27 DIAGNOSIS — R519 Headache, unspecified: Secondary | ICD-10-CM

## 2023-02-27 DIAGNOSIS — R29898 Other symptoms and signs involving the musculoskeletal system: Secondary | ICD-10-CM

## 2023-02-27 DIAGNOSIS — G8929 Other chronic pain: Secondary | ICD-10-CM

## 2023-02-27 MED ORDER — GADOPICLENOL 0.5 MMOL/ML IV SOLN
5.0000 mL | Freq: Once | INTRAVENOUS | Status: AC | PRN
  Administered 2023-02-27: 5 mL via INTRAVENOUS

## 2023-03-06 ENCOUNTER — Telehealth: Payer: Self-pay

## 2023-03-06 NOTE — Telephone Encounter (Signed)
-----   Message from Anson Fret, MD sent at 02/28/2023  7:32 AM EDT ----- If your arm is still hurting despite this MRI showing no nerve pinching we can provide a test called an emg/ncs, I will have my office call and explain thanks

## 2023-03-06 NOTE — Telephone Encounter (Signed)
I called and spoke with the patient to discuss the option of EMG/NCS. She stated that the prednisone had been helpful for her pain. The pain is more intermittent, positional, and occurs more at night. She would like to see if her symptoms improve or worsen until her appointment with Dr. Lucia Gaskins in August, then she will make a decision about EMG/NCS.

## 2023-04-24 ENCOUNTER — Telehealth: Payer: Self-pay | Admitting: Neurology

## 2023-04-24 ENCOUNTER — Encounter: Payer: Self-pay | Admitting: Neurology

## 2023-04-24 ENCOUNTER — Ambulatory Visit (INDEPENDENT_AMBULATORY_CARE_PROVIDER_SITE_OTHER): Payer: Managed Care, Other (non HMO) | Admitting: Neurology

## 2023-04-24 VITALS — BP 120/81 | HR 60 | Ht 62.0 in | Wt 108.0 lb

## 2023-04-24 DIAGNOSIS — R29898 Other symptoms and signs involving the musculoskeletal system: Secondary | ICD-10-CM | POA: Diagnosis not present

## 2023-04-24 DIAGNOSIS — G5603 Carpal tunnel syndrome, bilateral upper limbs: Secondary | ICD-10-CM | POA: Diagnosis not present

## 2023-04-24 DIAGNOSIS — R29818 Other symptoms and signs involving the nervous system: Secondary | ICD-10-CM | POA: Diagnosis not present

## 2023-04-24 DIAGNOSIS — M5442 Lumbago with sciatica, left side: Secondary | ICD-10-CM

## 2023-04-24 DIAGNOSIS — R200 Anesthesia of skin: Secondary | ICD-10-CM

## 2023-04-24 DIAGNOSIS — M5030 Other cervical disc degeneration, unspecified cervical region: Secondary | ICD-10-CM

## 2023-04-24 DIAGNOSIS — M4807 Spinal stenosis, lumbosacral region: Secondary | ICD-10-CM

## 2023-04-24 DIAGNOSIS — G8929 Other chronic pain: Secondary | ICD-10-CM

## 2023-04-24 MED ORDER — METHYLPREDNISOLONE 4 MG PO TBPK: ORAL_TABLET | ORAL | 1 refills | Status: DC

## 2023-04-24 MED ORDER — ONDANSETRON 8 MG PO TBDP
8.0000 mg | ORAL_TABLET | Freq: Three times a day (TID) | ORAL | 3 refills | Status: AC | PRN
Start: 2023-04-24 — End: ?

## 2023-04-24 NOTE — Telephone Encounter (Signed)
sent to GI they obtain [employer] auth [phone] 

## 2023-04-24 NOTE — Progress Notes (Signed)
[facility]    Provider:  Dr Lucia Gaskins Requesting Provider: Saralyn Pilar * Primary Care Provider:  Smitty Cords, DO  CC:  migraine  04/24/2023: She hugs me today. The [name] works exceptionally well. Maxalt works well. SHe used to have daily migraines that could last days. 6 migraine days a month and < 10 total headache days a month doing exception. Uses zofran for slight nausea and the phenergan for, she doesn't wake up with headaches, she has had >80% improvement in migraine freq and seveiry and duration and maxalt works acutely. Will give a higher dose of Zofran and refill phenergan for severe nausea and refill the [name].    Here for follow up on migraines.  At last visit we discussed numbness in the left arm, painful, radiating, with imbalance, radiating from the neck to the numbness in the left arm thumb and pointer.  We performed an MRI of the cervical spine which showed no nerve pinching.  In the meantime we discussed the option of EMG nerve conduction study, she took the prednisone that has been helpful for her pain but the pain is more intermittent and positional and occurs more at night.  Patient wanted to wait until this appointment to discuss EMG nerve conduction study. Better after steroids, positionally esp at night can be worse on the left arm.  At last visit in May 2024 we also treated patient for chronic migraine aggravated by medication overuse.  We discussed the sequelae of this.  We injected 3 [name] at the time to see how she would react, and prescribe 3 [name] every 3 months for her chronic migraines, we also discussed Qulipta.  Insurance may want monthly Emgality, unclear.  Hopefully when she came back this time her migraines would be still improved then we could use Qulipta for episodic migraine.  Asked her to stop over-the-counter meds.  Switched her to Zofran and rizatriptan acutely.  Gave her a Medrol Dosepak for 1 week.  And ordered an MRI  of the brain.  Personally reviewed images of MRI c spine and head and agree: MRI c-spine MRI cervical spine (without) demonstrating: - ACDF C5-C6 with metal hardware and adequate decompression. - At C6-7 broad disc bulging with mild bilateral foraminal stenosis. - At C4-5 disc bulging with no spinal stenosis or foraminal narrowing  MRI brain:IMPRESSION:    Unremarkable MRI brain (with and without). No acute findings.   Patient complains of symptoms per HPI as well as the following symptoms: neck pain . Pertinent negatives and positives per HPI. All others negative     HPI 01/16/2023:  Carmen Logan is a 52 y.o. female here as requested by Saralyn Pilar * for migraine. has Surgical menopause; HPV test positive; Status post vaginal hysterectomy; History of endometriosis; GAD (generalized anxiety disorder); Major depressive disorder, recurrent episode, in partial remission (HCC); Episodic migraine; Shift work sleep disorder; Insomnia; RUQ abdominal pain; Gastric atrophy; Screen for colon cancer; Cervicogenic headache; Chronic migraine without aura, with intractable migraine, so stated, with status migrainosus; and Medication overuse headache on their problem list.   Headaches and migraines daily. Wakes up every day with a headache. Doesn't know if snores. Not "abnormally tired" during the day, wakes up refreshed. She is taking excedrin ibuprofen and tylenol everyday.: medication overuse/rebound headache and warned of the side effects of bleeding or organ damage.(Gastritis). She will have headaches may turn into a migraines, migrianes start with loss of visiom splotchy. Throbbing/pulsating,pounding/stabbing, moving head make it worse, nausea, photo/phononphobia,vomiting, going  into a dark and quiet room helps and a cold wrap, unknown triggers, weather will trigger. Started a a child, Fhx of migraines.  Numbness in the left arm, painful, radiating, imbalance, radiates to thumb and pointer,  always from the neck. Can elicit with certain neck movement, failed conservative measures including OTC, heat, stretching, yoga > 3 months, falls, been under the care of doctors for 2 years, progressive  Reviewed notes, labs and imaging from outside physicians, which showed :  Medications tried: imitrex, fioricet, tylenol, excedrin, ibuprofen, nurect,ubrelvy, buspirone, amitriptyline and nortriptyline are contraindicated because she is already on serotonergic drugs and may cause serotonin syndrome, aimovig contraindicated due to constipation, blood pressure medications are contraindicated due to hypotension and bradycardia, Lexapro, Decadron, gabapentin, Zofran, effexor, maxalt, trazodone, venlafaxine, topiramate in the past side effects mental fogginess  Tsh nml 07/2022    Latest Ref Rng & Units 01/16/2023    9:12 AM 08/08/2022   10:21 AM 03/15/2021   10:47 AM  CMP  Glucose 70 - 99 mg/dL 90  85  88   BUN 6 - 24 mg/dL 8  9  8    Creatinine 0.57 - 1.00 mg/dL 1.61  0.96  0.45   Sodium 134 - 144 mmol/L 140  140  140   Potassium 3.5 - 5.2 mmol/L 4.5  4.1  4.0   Chloride 96 - 106 mmol/L 100  100  101   CO2 20 - 29 mmol/L 29  24  26    Calcium 8.7 - 10.2 mg/dL 8.9  9.4  9.2   Total Protein 6.0 - 8.5 g/dL  7.3  6.8   Total Bilirubin 0.0 - 1.2 mg/dL  0.6  0.4   Alkaline Phos 44 - 121 IU/L  83  74   AST 0 - 40 IU/L  15  10   ALT 0 - 32 IU/L  15  8       Latest Ref Rng & Units 08/08/2022   10:21 AM 03/15/2021   10:47 AM 10/20/2020    9:50 AM  CBC  WBC 3.4 - 10.8 x10E3/uL 5.5  5.9  7.4   Hemoglobin 11.1 - 15.9 g/dL 40.9  81.1  91.4   Hematocrit 34.0 - 46.6 % 41.1  37.3  38.6   Platelets 150 - 450 x10E3/uL 212  197  214    IMAGING PRIOR TO CERVICAL DISKECTOMY  Thoracic 2015: FINDINGS: The vertebral bodies are normally aligned. The thoracic vertebral body heights and disc spaces are well preserved. There is a Schmorl's node within the superior endplate of L1.  Small foci of increased T1 and T2  signal are noted in T5 and T7, likely benign hemangiomas.  The signal intensity from the spinal cord is normal.  At T10-11, there is a shallow right paracentral disc protrusion which minimally indents the ventral thecal sac. There is no significant spinal canal or neural foraminal stenosis. The conus medullaris ends at a normal level.  IMPRESSION: No spinal canal stenosis or abnormal spinal cord signal. Small left paracentral disc protrusion at T10-11. Other Result Text  Interface, Rad Results In - 01/30/2014 10:25 AM EDT EXAM INFO: [id number] UN 01/30/14  09:28:[id number] [facility]) : MRI THORACIC SPINE WO CONTRAST  EXAM: Magnetic resonance imaging, spinal canal and contents, thoracic, without contrast material.  DICTATED: 01/30/14 09:30:38  Interpretation location: Main campus.  CLINICAL INDICATION: 51 year old F.  719.41 - Shoulder pain, right.  COMPARISON: None available.  TECHNIQUE: Multiplanar MRI was performed through the thoracic spine without contrast administration.  FINDINGS: The vertebral bodies are normally aligned. The thoracic vertebral body heights and disc spaces are well preserved. There is a Schmorl's node within the superior endplate of L1.  Small foci of increased T1 and T2 signal are noted in T5 and T7, likely benign hemangiomas.  The signal intensity from the spinal cord is normal.  At T10-11, there is a shallow right paracentral disc protrusion which minimally indents the ventral thecal sac. There is no significant spinal canal or neural foraminal stenosis. The conus medullaris ends at a normal level.  IMPRESSION: No spinal canal stenosis or abnormal spinal cord signal. Small left paracentral disc protrusion at T10-11.  Cervical 2015: DICTATED: 01/30/14 09:28:53   CLINICAL INDICATION: 52 year-old F with history of severe right shoulder pain with numbness and tingling in the right upper extremity.   TECHNIQUE: Multiplanar MRI was performed through the cervical  spine without contrast administration.   COMPARISON: None   FINDINGS: There is reversal of the normal cervical lordosis.  The signal intensity from the vertebral body bone marrow and spinal cord is normal.    C2-C3: There is no significant spinal canal or neural foraminal stenosis.   C3-C4: There is some disc osteophyte complex without significant spinal canal or neural foraminal stenosis.   C4-C5: There is a disc osteophyte complex without significant spinal canal or neural foraminal stenosis.   C5-C6: There is disc osteophyte complex causing moderate bilateral neural foraminal stenosis. There is mild spinal canal stenosis.   C6-C7: There is disc osteophyte complex with a central disc protrusion causing mild spinal canal stenosis. There is no significant neural foraminal stenosis.   C7-T1: There is no significant spinal canal or neural foraminal stenosis.   INTERPRETATION LOCATION:  Main [facility]   IMPRESSION:   Degenerative changes as described above. There is mild canal stenosis and moderate bilateral neural foraminal stenosis at C5-C6. At C6-C7 there is a central disc protrusion causing mild spinal canal stenosis.    Review of Systems: Patient complains of symptoms per HPI as well as the following symptoms migraine. Pertinent negatives and positives per HPI. All others negative.   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  Tobacco Use   Smoking status: Former    Current packs/day: 0.00    Types: Cigarettes    Quit date: 2015    Years since quitting: 9.6   Smokeless tobacco: Never   Tobacco comments:    Unsure duration    Pt vapes daily   Vaping Use   Vaping status: Every Day   Last attempt to quit: 02/25/2014  Substance and Sexual  Activity   Alcohol use: Yes    Comment: occasional   Drug use: No   Sexual activity: Yes    Birth control/protection: Surgical  Other Topics Concern   Not on file  Social History Narrative   Caffeine: 4 cups/day   Social Determinants of Health   Financial Resource Strain: Not on file  Food Insecurity: Not on file  Transportation Needs: Not on file  Physical Activity: Insufficiently Active (01/24/2018)   Exercise Vital Sign    Days of Exercise per Week: 1 day    Minutes of Exercise per Session: 30 min  Stress: Not on file  Social Connections: Not on file  Intimate Partner Violence: Not on file    Family History  Problem Relation Age of Onset   Heart disease Mother    Hypertension Mother    Thyroid disease Mother    Migraines Mother    Heart disease Father    Heart attack Father 91   Depression Sister    Diabetes Maternal Aunt    Migraines Maternal Aunt    Diabetes Maternal Grandmother    Cancer Neg Hx    Breast cancer Neg Hx     Past Medical History:  Diagnosis Date   Anxiety    Cervical dysplasia    Depression    Dyspareunia, female    Endometriosis    Facial skin lesion    HPV test positive    Migraine    "seems weather related"   Surgical menopause    Wears contact lenses    sometimes    Patient Active Problem List   Diagnosis Date Noted   Chronic migraine without aura, with intractable migraine, so stated, with status migrainosus 01/16/2023   Medication overuse headache 01/16/2023   Cervicogenic headache 11/14/2022   RUQ abdominal pain    Gastric atrophy    Screen for colon cancer    Shift work sleep disorder 03/08/2020   Insomnia 03/08/2020   GAD (generalized anxiety disorder) 09/02/2018   Major depressive disorder, recurrent episode, in partial remission (HCC) 09/02/2018   Episodic migraine 09/02/2018   Surgical menopause 08/04/2015   HPV test positive 08/04/2015   Status post vaginal hysterectomy 08/04/2015   History of endometriosis  08/04/2015    Past Surgical History:  Procedure Laterality Date   BIOPSY N/A 11/16/2020   Procedure: BIOPSY;  Surgeon: Pasty Spillers, MD;  Location: [facility];  Service: Endoscopy;  Laterality: N/A;   CERVICAL DISCECTOMY  03/17/2014   w/ fusion and plating   COLONOSCOPY WITH PROPOFOL N/A 11/16/2020   Procedure: COLONOSCOPY WITH PROPOFOL;  Surgeon: Pasty Spillers, MD;  Location: [facility];  Service: Endoscopy;  Laterality: N/A;   CRYOTHERAPY     cervix   ESOPHAGOGASTRODUODENOSCOPY N/A 11/16/2020   Procedure: ESOPHAGOGASTRODUODENOSCOPY (EGD);  Surgeon: Pasty Spillers, MD;  Location: [facility];  Service: Endoscopy;  Laterality: N/A;   TUBAL LIGATION  1999   VAGINAL HYSTERECTOMY  10/2009   Total vaginal hysterectomy, lso    Current Outpatient Medications  Medication Sig Dispense Refill   acetaminophen (TYLENOL) 500 MG chewable tablet Chew 500 mg by mouth every 6 (six) hours as needed for pain.     busPIRone (BUSPAR) 7.5 MG tablet TAKE 1 TABLET(7.5 MG) BY MOUTH TWICE DAILY AS NEEDED 180 tablet 0   escitalopram (LEXAPRO) 10 MG tablet Take 1 tablet (10 mg total) by mouth daily. 90 tablet 1   Fremanezumab-vfrm ([name]) 225 MG/1.5ML SOAJ Inject 675 mg into the skin every 3 (three) months. 4.5 mL 11   Multiple Vitamin (MULTIVITAMIN WITH MINERALS) TABS tablet Take 1 tablet by mouth daily.     omeprazole (PRILOSEC) 20 MG capsule Take 1 capsule (20 mg total) by mouth daily. (Patient taking differently: Take 20 mg by mouth daily as needed (acid reflux).) 30 capsule 2   ondansetron (ZOFRAN-ODT) 8 MG disintegrating tablet Take 1 tablet (8 mg total) by mouth every 8 (eight) hours as needed. 90 tablet 3   rizatriptan (MAXALT-MLT) 10 MG disintegrating tablet Take 1 tablet (10 mg total) by mouth as needed for migraine. May repeat in 2 hours if needed  9 tablet 11   traZODone (DESYREL) 50 MG tablet Take 1 tablet (50 mg total) by mouth at bedtime. 90 tablet 1    methylPREDNISolone (MEDROL DOSEPAK) 4 MG TBPK tablet Take pills daily all together with food. Take the first dose (6 pills) as soon as possible. Take the rest each morning. For 6 days total 6-5-4-3-2-1. 21 tablet 1   Current Facility-Administered Medications  Medication Dose Route Frequency Provider Last Rate Last Admin   Fremanezumab-vfrm SOSY 675 mg  675 mg Subcutaneous Once Anson Fret, MD        Allergies as of 04/24/2023   (No Known Allergies)    Vitals: BP 120/81 (BP Location: Right Arm, Patient Position: Sitting)   Pulse 60   Ht 5\' 2"  (1.575 m)   Wt 108 lb (49 kg)   BMI 19.75 kg/m  Last Weight:  Wt Readings from Last 1 Encounters:  04/24/23 108 lb (49 kg)   Last Height:   Ht Readings from Last 1 Encounters:  04/24/23 5\' 2"  (1.575 m)   Physical exam: Exam: Gen: NAD, conversant, well nourised, well groomed                     CV: RRR, no MRG. No Carotid Bruits. No peripheral edema, warm, nontender Eyes: Conjunctivae clear without exudates or hemorrhage  Neuro: Detailed Neurologic Exam  Speech:    Speech is normal; fluent and spontaneous with normal comprehension.  Cognition:    The patient is oriented to person, place, and time;     recent and remote memory intact;     language fluent;     normal attention, concentration,     fund of knowledge Cranial Nerves:    The pupils are equal, round, and reactive to light. The fundi are normal and spontaneous venous pulsations are present. Visual fields are full to finger confrontation. Extraocular movements are intact. Trigeminal sensation is intact and the muscles of mastication are normal. The face is symmetric. The palate elevates in the midline. Hearing intact. Voice is normal. Shoulder shrug is normal. The tongue has normal motion without fasciculations.   Coordination:    Normal finger to nose and heel to shin. Normal rapid alternating movements.   Gait:    Heel-toe and tandem gait are normal.   Motor  Observation:    No asymmetry, no atrophy, and no involuntary movements noted. Tone:    Normal muscle tone.    Posture:    Posture is normal. normal erect    Strength: Decreased grip and opponens. Left leg hio flexio, leg flexion and dorsiflexion weakness     Sensation: intact to LT     Reflex Exam:  DTR's:    Deep tendon reflexes in the upper and lower extremities are normal bilaterally.   Toes:    The toes are downgoing bilaterally.   Clonus:    Clonus is absent.  Assessment/Plan:  Patient with chronic migraine aggravated by medication overuse;  Also Numbness in the left arm, painful, radiating, imbalance, radiates to thumb and pointer, has neck pain (see MRI c-spine). Numbness of arm Worse at night and positionally, failed conservative measures including OTC, heat, stretching, yoga > 3 months, falls, been under the care of doctors for 2 years, progressive  - MRI c-spine appears to be causing arthritic neck pain, consider seeing Dr. Lorrine Kin for injections - Sensory changes in hands may be CTS, + Mcphalens - emg/ncs on bilateral uppers - Inject 3 [name] every 3 months, doing  fantastic - maxalt works great - Stopped using the OTC meds - MAX 10 days a month - AT onset of headache: Stop imitrex.Use Rizatriptan and zofrn 4-8mg  then repeat in 2 hours. Can also use zofran 4-8 mg as needed for dizziness or car sickness or motion sickness - Medrol dosepak for one week  as needed not too foten as to not overuse steroids - MRI brain: unremarkable MRI cervical spine: - ACDF C5-C6 with metal hardware and adequate decompression.- At C6-7 broad disc bulging with mild bilateral foraminal stenosis.- At C4-5 disc bulging with no spinal stenosis or foraminal narrowing. Send to Dr. Lorrine Kin for pain procedure into the c -spine - Likely CTS: emg/ncs bilateral uppers. +Mcphalen's. Can't open jars. Nocturnal numbness. - Chronic low back pain on the left, has had it ten years, progressive the last 2 years,  has tried conservative measures > 6 minths and failed, been under the care of doctors, sciatic left leg with weaknnes in lef, shooting in to the buttocka and legs, numbness and tingling in the foot, problems ambulating, can't wwalk or stand for too long has to sit down. MRI lumbar spine.   Orders Placed This Encounter  Procedures   MR LUMBAR SPINE WO CONTRAST   Ambulatory referral to Pain Clinic   NCV with EMG(electromyography)   Meds ordered this encounter  Medications   ondansetron (ZOFRAN-ODT) 8 MG disintegrating tablet    Sig: Take 1 tablet (8 mg total) by mouth every 8 (eight) hours as needed.    Dispense:  90 tablet    Refill:  3   methylPREDNISolone (MEDROL DOSEPAK) 4 MG TBPK tablet    Sig: Take pills daily all together with food. Take the first dose (6 pills) as soon as possible. Take the rest each morning. For 6 days total 6-5-4-3-2-1.    Dispense:  21 tablet    Refill:  1    Cc: Jackolyn Confer, Netta Neat, DO  Naomie Dean, MD  [facility] [address] [place], [place] [address]  Phone [phone] Fax [phone]  I spent over 40 minutes of face-to-face and non-face-to-face time with patient on the  1. Bilateral carpal tunnel syndrome   2. Neurogenic claudication   3. Left leg weakness   4. Left leg numbness   5. Chronic bilateral low back pain with left-sided sciatica   6. Spinal stenosis of lumbosacral region   7. DDD (degenerative disc disease), cervical     diagnosis.  This included previsit chart review, lab review, study review, order entry, electronic health record documentation, patient education on the different diagnostic and therapeutic options, counseling and coordination of care, risks and benefits of management, compliance, or risk factor reduction'

## 2023-04-24 NOTE — Patient Instructions (Signed)
-   MRI c-spine appears to be causing arthritic neck pain, consider seeing Dr. Lorrine Kin for injections - Sensory changes in hands may be CTS, + Mcphalens - emg/ncs on bilateral uppers - Inject 3 [name] every 3 months, doing fantastic - maxalt works great - Stopped using the OTC meds - MAX 10 days a month - AT onset of headache: Stop imitrex.Use Rizatriptan and zofrn 4-8mg  then repeat in 2 hours. Can also use zofran 4-8 mg as needed for dizziness or car sickness or motion sickness - Medrol dosepak for one week  as needed not too foten as to not overuse steroids - MRI brain: unremarkable MRI cervical spine: - ACDF C5-C6 with metal hardware and adequate decompression.- At C6-7 broad disc bulging with mild bilateral foraminal stenosis.- At C4-5 disc bulging with no spinal stenosis or foraminal narrowing. Send to Dr. Lorrine Kin for pain procedure into the c -spine - Likely CTS: emg/ncs bilateral uppers. +Mcphalen's. Can't open jars. Nocturnal numbness. - Chronic low back pain on the left, has had it ten years, progressive the last 2 years, has tried conservative measures > 6 minths and failed, been under the care of doctors, sciatic left leg with weaknnes in lef, shooting in to the buttocka and legs, numbness and tingling in the foot, problems ambulating, can't wwalk or stand for too long has to sit down. MRI lumbar spine.   Orders Placed This Encounter  Procedures   NCV with EMG(electromyography)   Meds ordered this encounter  Medications   ondansetron (ZOFRAN-ODT) 8 MG disintegrating tablet    Sig: Take 1 tablet (8 mg total) by mouth every 8 (eight) hours as needed.    Dispense:  90 tablet    Refill:  3   methylPREDNISolone (MEDROL DOSEPAK) 4 MG TBPK tablet    Sig: Take pills daily all together with food. Take the first dose (6 pills) as soon as possible. Take the rest each morning. For 6 days total 6-5-4-3-2-1.    Dispense:  21 tablet    Refill:  1

## 2023-04-25 ENCOUNTER — Telehealth: Payer: Self-pay | Admitting: Neurology

## 2023-04-25 NOTE — Telephone Encounter (Signed)
Referral for pain clinic fax to [facility]. Phone: [phone], Fax: [phone]. 

## 2023-04-26 ENCOUNTER — Ambulatory Visit: Payer: Self-pay | Admitting: Neurology

## 2023-04-26 ENCOUNTER — Ambulatory Visit: Payer: Managed Care, Other (non HMO) | Admitting: Neurology

## 2023-04-26 DIAGNOSIS — G5603 Carpal tunnel syndrome, bilateral upper limbs: Secondary | ICD-10-CM

## 2023-04-26 DIAGNOSIS — R200 Anesthesia of skin: Secondary | ICD-10-CM

## 2023-04-26 DIAGNOSIS — Z0289 Encounter for other administrative examinations: Secondary | ICD-10-CM

## 2023-04-26 NOTE — Procedures (Signed)
Full Name: Carmen Logan Gender: Female MRN #: [id number] Date of Birth: February 15, 1971    Visit Date: 04/26/2023 09:26 Age: 52 Years Examining Physician: Dr. Naomie Dean Referring Physician: Dr. Naomie Dean Height: 5 feet 2 inch  History: Pain in the left arm and left leg with neck pain and low back pain EMG/NCS was performed on the left upper and left lower extremities. All nerves and muscles (as indicated in the following tables) were within normal limits.       Conclusion: This is a normal study of the left upper and lower extremities. No evidence for mononeuropathy, large-fiber polyneuropathy, radiculopathy or neurogenic muscle disease.     ------------------------------- Naomie Dean, M.D.  [facility] [address] [place], [place] [address] Tel: [phone] Fax: [phone]  Verbal informed consent was obtained from the patient, patient was informed of potential risk of procedure, including bruising, bleeding, hematoma formation, infection, muscle weakness, muscle pain, numbness, among others.        MNC    Nerve / Sites Muscle Latency Ref. Amplitude Ref. Rel Amp Segments Distance Velocity Ref. Area    ms ms mV mV %  cm m/s m/s mVms  L Median - APB     Wrist APB 2.9 ?4.4 6.7 ?4.0 100 Wrist - APB 7   26.8     Upper arm APB 7.1  8.2  122 Upper arm - Wrist 24 56 ?49 30.9  R Median - APB     Wrist APB 3.7 ?4.4 6.8 ?4.0 100 Wrist - APB 7   20.7     Upper arm APB 8.6  7.7  114 Upper arm - Wrist 24.6 50 ?49 25.2  L Ulnar - ADM     Wrist ADM 2.2 ?3.3 8.6 ?6.0 100 Wrist - ADM 7   26.9     B.Elbow ADM 3.7  7.8  90.8 B.Elbow - Wrist 11 72 ?49 24.6     A.Elbow ADM 5.7  7.2  92.1 A.Elbow - B.Elbow 13.6 69 ?49 24.2  R Ulnar - ADM     Wrist ADM 2.4 ?3.3 8.3 ?6.0 100 Wrist - ADM 7   25.3     B.Elbow ADM 4.0  8.4  102 B.Elbow - Wrist 11 67 ?49 24.6     A.Elbow ADM 6.3  7.6  90.3 A.Elbow - B.Elbow 15 65 ?49 23.2  L Peroneal - EDB     Ankle  EDB 4.9 ?6.5 4.1 ?2.0 100 Ankle - EDB 9   14.8     Fib head EDB 9.5  3.6  86.2 Fib head - Ankle 22.6 49 ?44 13.6     Pop fossa EDB 11.4  3.6  101 Pop fossa - Fib head 10.6 57 ?44 14.1         Pop fossa - Ankle      L Tibial - AH     Ankle AH 4.3 ?5.8 11.7 ?4.0 100 Ankle - AH 9   30.6     Pop fossa AH 10.5  7.7  65.6 Pop fossa - Ankle 35 56 ?41 25.0                 SNC    Nerve / Sites Rec. Site Peak Lat Ref.  Amp Ref. Segments Distance Peak Diff Ref.    ms ms V V  cm ms ms  L Sural - Ankle (Calf)     Calf Ankle 3.4 ?4.4 13 ?6  Calf - Ankle 14    L Superficial peroneal - Ankle     Lat leg Ankle 4.0 ?4.4 9 ?6 Lat leg - Ankle 14    L Median, Ulnar - Transcarpal comparison     Median Palm Wrist 2.0 ?2.2 45 ?35 Median Palm - Wrist 8       Ulnar Palm Wrist 2.0 ?2.2 30 ?12 Ulnar Palm - Wrist 8          Median Palm - Ulnar Palm  0.0 ?0.4  R Median, Ulnar - Transcarpal comparison     Median Palm Wrist 2.1 ?2.2 72 ?35 Median Palm - Wrist 8       Ulnar Palm Wrist 2.2 ?2.2 55 ?12 Ulnar Palm - Wrist 8          Median Palm - Ulnar Palm  -0.1 ?0.4  L Median - Orthodromic (Dig II, Mid palm)     Dig II Wrist 3.1 ?3.4 18 ?10 Dig II - Wrist 13    R Median - Orthodromic (Dig II, Mid palm)     Dig II Wrist 3.3 ?3.4 27 ?10 Dig II - Wrist 13    L Ulnar - Orthodromic, (Dig V, Mid palm)     Dig V Wrist 3.0 ?3.1 23 ?5 Dig V - Wrist 11    R Ulnar - Orthodromic, (Dig V, Mid palm)     Dig V Wrist 2.8 ?3.1 14 ?5 Dig V - Wrist 19                       F  Wave    Nerve F Lat Ref.   ms ms  L Tibial - AH 44.4 ?56.0  L Ulnar - ADM 24.6 ?32.0  R Ulnar - ADM 24.9 ?32.0           H Reflex    Nerve H Lat Lat Hmax   ms ms   Left Right Ref. Left Right Ref.  Tibial - Soleus 34.2 33.1 ?35.0 24.9 24.4 ?35.0         EMG Summary Table    Spontaneous MUAP Recruitment  Muscle IA Fib PSW Fasc Other Amp Dur. Poly Pattern  L. Deltoid Normal None None None _______ Normal Normal Normal Normal  L. Triceps brachii  Normal None None None _______ Normal Normal Normal Normal  L. Biceps brachii Normal None None None _______ Normal Normal Normal Normal  L. Pronator teres Normal None None None _______ Normal Normal Normal Normal  L. First dorsal interosseous Normal None None None _______ Normal Normal Normal Normal  L. Opponens pollicis Normal None None None _______ Normal Normal Normal Normal  L. Cervical paraspinals (low) Normal None None None _______ Normal Normal Normal Normal  L. Vastus medialis Normal None None None _______ Normal Normal Normal Normal  L. Tibialis anterior Normal None None None _______ Normal Normal Normal Normal  L. Gastrocnemius (Medial head) Normal None None None _______ Normal Normal Normal Normal  L. Extensor hallucis longus Normal None None None _______ Normal Normal Normal Normal  L. Biceps femoris (long head) Normal None None None _______ Normal Normal Normal Normal  L. Gluteus maximus Normal None None None _______ Normal Normal Normal Normal  L. Gluteus medius Normal None None None _______ Normal Normal Normal Normal  L. Lumbar paraspinals (low) Normal None None None _______ Normal Normal Normal Normal  L. Extensor indicis proprius Normal None None None _______ Normal Normal Normal Normal

## 2023-04-26 NOTE — Progress Notes (Signed)
Full Name: Carmen Logan Gender: Female MRN #: [id number] Date of Birth: February 15, 1971    Visit Date: 04/26/2023 09:26 Age: 52 Years Examining Physician: Dr. Naomie Dean Referring Physician: Dr. Naomie Dean Height: 5 feet 2 inch  History: Pain in the left arm and left leg with neck pain and low back pain EMG/NCS was performed on the left upper and left lower extremities. All nerves and muscles (as indicated in the following tables) were within normal limits.       Conclusion: This is a normal study of the left upper and lower extremities. No evidence for mononeuropathy, large-fiber polyneuropathy, radiculopathy or neurogenic muscle disease.     ------------------------------- Naomie Dean, M.D.  [facility] [address] [place], [place] [address] Tel: [phone] Fax: [phone]  Verbal informed consent was obtained from the patient, patient was informed of potential risk of procedure, including bruising, bleeding, hematoma formation, infection, muscle weakness, muscle pain, numbness, among others.        MNC    Nerve / Sites Muscle Latency Ref. Amplitude Ref. Rel Amp Segments Distance Velocity Ref. Area    ms ms mV mV %  cm m/s m/s mVms  L Median - APB     Wrist APB 2.9 ?4.4 6.7 ?4.0 100 Wrist - APB 7   26.8     Upper arm APB 7.1  8.2  122 Upper arm - Wrist 24 56 ?49 30.9  R Median - APB     Wrist APB 3.7 ?4.4 6.8 ?4.0 100 Wrist - APB 7   20.7     Upper arm APB 8.6  7.7  114 Upper arm - Wrist 24.6 50 ?49 25.2  L Ulnar - ADM     Wrist ADM 2.2 ?3.3 8.6 ?6.0 100 Wrist - ADM 7   26.9     B.Elbow ADM 3.7  7.8  90.8 B.Elbow - Wrist 11 72 ?49 24.6     A.Elbow ADM 5.7  7.2  92.1 A.Elbow - B.Elbow 13.6 69 ?49 24.2  R Ulnar - ADM     Wrist ADM 2.4 ?3.3 8.3 ?6.0 100 Wrist - ADM 7   25.3     B.Elbow ADM 4.0  8.4  102 B.Elbow - Wrist 11 67 ?49 24.6     A.Elbow ADM 6.3  7.6  90.3 A.Elbow - B.Elbow 15 65 ?49 23.2  L Peroneal - EDB     Ankle  EDB 4.9 ?6.5 4.1 ?2.0 100 Ankle - EDB 9   14.8     Fib head EDB 9.5  3.6  86.2 Fib head - Ankle 22.6 49 ?44 13.6     Pop fossa EDB 11.4  3.6  101 Pop fossa - Fib head 10.6 57 ?44 14.1         Pop fossa - Ankle      L Tibial - AH     Ankle AH 4.3 ?5.8 11.7 ?4.0 100 Ankle - AH 9   30.6     Pop fossa AH 10.5  7.7  65.6 Pop fossa - Ankle 35 56 ?41 25.0                 SNC    Nerve / Sites Rec. Site Peak Lat Ref.  Amp Ref. Segments Distance Peak Diff Ref.    ms ms V V  cm ms ms  L Sural - Ankle (Calf)     Calf Ankle 3.4 ?4.4 13 ?6  Calf - Ankle 14    L Superficial peroneal - Ankle     Lat leg Ankle 4.0 ?4.4 9 ?6 Lat leg - Ankle 14    L Median, Ulnar - Transcarpal comparison     Median Palm Wrist 2.0 ?2.2 45 ?35 Median Palm - Wrist 8       Ulnar Palm Wrist 2.0 ?2.2 30 ?12 Ulnar Palm - Wrist 8          Median Palm - Ulnar Palm  0.0 ?0.4  R Median, Ulnar - Transcarpal comparison     Median Palm Wrist 2.1 ?2.2 72 ?35 Median Palm - Wrist 8       Ulnar Palm Wrist 2.2 ?2.2 55 ?12 Ulnar Palm - Wrist 8          Median Palm - Ulnar Palm  -0.1 ?0.4  L Median - Orthodromic (Dig II, Mid palm)     Dig II Wrist 3.1 ?3.4 18 ?10 Dig II - Wrist 13    R Median - Orthodromic (Dig II, Mid palm)     Dig II Wrist 3.3 ?3.4 27 ?10 Dig II - Wrist 13    L Ulnar - Orthodromic, (Dig V, Mid palm)     Dig V Wrist 3.0 ?3.1 23 ?5 Dig V - Wrist 11    R Ulnar - Orthodromic, (Dig V, Mid palm)     Dig V Wrist 2.8 ?3.1 14 ?5 Dig V - Wrist 19                       F  Wave    Nerve F Lat Ref.   ms ms  L Tibial - AH 44.4 ?56.0  L Ulnar - ADM 24.6 ?32.0  R Ulnar - ADM 24.9 ?32.0           H Reflex    Nerve H Lat Lat Hmax   ms ms   Left Right Ref. Left Right Ref.  Tibial - Soleus 34.2 33.1 ?35.0 24.9 24.4 ?35.0         EMG Summary Table    Spontaneous MUAP Recruitment  Muscle IA Fib PSW Fasc Other Amp Dur. Poly Pattern  L. Deltoid Normal None None None _______ Normal Normal Normal Normal  L. Triceps brachii  Normal None None None _______ Normal Normal Normal Normal  L. Biceps brachii Normal None None None _______ Normal Normal Normal Normal  L. Pronator teres Normal None None None _______ Normal Normal Normal Normal  L. First dorsal interosseous Normal None None None _______ Normal Normal Normal Normal  L. Opponens pollicis Normal None None None _______ Normal Normal Normal Normal  L. Cervical paraspinals (low) Normal None None None _______ Normal Normal Normal Normal  L. Vastus medialis Normal None None None _______ Normal Normal Normal Normal  L. Tibialis anterior Normal None None None _______ Normal Normal Normal Normal  L. Gastrocnemius (Medial head) Normal None None None _______ Normal Normal Normal Normal  L. Extensor hallucis longus Normal None None None _______ Normal Normal Normal Normal  L. Biceps femoris (long head) Normal None None None _______ Normal Normal Normal Normal  L. Gluteus maximus Normal None None None _______ Normal Normal Normal Normal  L. Gluteus medius Normal None None None _______ Normal Normal Normal Normal  L. Lumbar paraspinals (low) Normal None None None _______ Normal Normal Normal Normal  L. Extensor indicis proprius Normal None None None _______ Normal Normal Normal Normal

## 2023-05-12 ENCOUNTER — Other Ambulatory Visit: Payer: Managed Care, Other (non HMO)

## 2023-05-21 ENCOUNTER — Other Ambulatory Visit: Payer: Self-pay | Admitting: Family Medicine

## 2023-05-21 DIAGNOSIS — F332 Major depressive disorder, recurrent severe without psychotic features: Secondary | ICD-10-CM

## 2023-05-21 NOTE — Telephone Encounter (Signed)
Medication Refill - Medication: escitalopram (LEXAPRO) 10 MG tablet [[id number]]    Has the patient contacted their pharmacy? Yes     Preferred Pharmacy?[id number] [place], [place] - [address] AT [facility] [address] [facility]  [address] [place], [place] [address]  Phone:  [phone]  Fax:  [phone]  DEA #:  [id number]    Has the patient been seen for an appointment in the last year OR does the patient have an upcoming appointment? Yes

## 2023-05-22 ENCOUNTER — Other Ambulatory Visit: Payer: Self-pay | Admitting: Family Medicine

## 2023-05-22 DIAGNOSIS — F332 Major depressive disorder, recurrent severe without psychotic features: Secondary | ICD-10-CM

## 2023-05-22 NOTE — Telephone Encounter (Signed)
Requested Prescriptions  Refused Prescriptions Disp Refills   escitalopram (LEXAPRO) 10 MG tablet 90 tablet 1    Sig: Take 1 tablet (10 mg total) by mouth daily.     Psychiatry:  Antidepressants - SSRI Passed - 05/21/2023  3:49 PM      Passed - Completed PHQ-2 or PHQ-9 in the last 360 days      Passed - Valid encounter within last 6 months    Recent Outpatient Visits           4 months ago Major depressive disorder, recurrent episode, in partial remission [facility])   [name], Netta Neat, DO   6 months ago Severe episode of recurrent major depressive disorder, without psychotic features [facility]   [place] [facility] Smitty Cords, DO   9 months ago Annual physical exam   [place] [facility] Smitty Cords, DO   2 years ago Annual physical exam   [place] [facility] Smitty Cords, DO   2 years ago Unintentional weight loss   [place] [facility] Smitty Cords, DO       Future Appointments             In 3 months [place] Charon, Netta Neat, DO [place] [facility]

## 2023-05-23 ENCOUNTER — Encounter: Payer: Self-pay | Admitting: Family Medicine

## 2023-05-23 DIAGNOSIS — F332 Major depressive disorder, recurrent severe without psychotic features: Secondary | ICD-10-CM

## 2023-05-23 MED ORDER — ESCITALOPRAM OXALATE 10 MG PO TABS
10.0000 mg | ORAL_TABLET | Freq: Every day | ORAL | 1 refills | Status: DC
Start: 2023-05-23 — End: 2023-08-28

## 2023-05-23 NOTE — Addendum Note (Signed)
Addended by: Kavin Leech E on: 05/23/2023 03:40 PM   Modules accepted: Orders

## 2023-07-02 ENCOUNTER — Encounter: Payer: Self-pay | Admitting: Neurology

## 2023-07-04 ENCOUNTER — Telehealth: Payer: Self-pay | Admitting: *Deleted

## 2023-07-04 NOTE — Telephone Encounter (Signed)
[name] PA needed per pt report.

## 2023-07-05 ENCOUNTER — Other Ambulatory Visit (HOSPITAL_COMMUNITY): Payer: Self-pay

## 2023-07-05 ENCOUNTER — Telehealth: Payer: Self-pay

## 2023-07-05 NOTE — Telephone Encounter (Signed)
PA request has been Submitted. New Encounter created for follow up. For additional info see Pharmacy Prior Auth telephone encounter from 07/05/2023.

## 2023-07-05 NOTE — Telephone Encounter (Signed)
Pharmacy Patient Advocate Encounter   Received notification from Physician's Office that prior authorization for [name] (fremanezumab-vfrm) injection 225MG /1.5ML auto-injectors is required/requested.   Insurance verification completed.   The patient is insured through [facility] .   Per test claim: PA required; PA submitted to above mentioned insurance via CoverMyMeds Key/confirmation #/EOC [id number] Status is pending

## 2023-07-11 MED ORDER — EMGALITY 120 MG/ML ~~LOC~~ SOAJ: 120.0000 mg | SUBCUTANEOUS | 1 refills | Status: DC

## 2023-07-11 NOTE — Telephone Encounter (Signed)
I sent a message to the pt see if she wants Emgality instead. Looks like Dr Lucia Gaskins had told her insurance may prefer this at her last appt.

## 2023-07-11 NOTE — Addendum Note (Signed)
Addended by: Bertram Savin on: 07/11/2023 03:32 PM   Modules accepted: Orders

## 2023-07-11 NOTE — Telephone Encounter (Signed)
Noted  

## 2023-07-11 NOTE — Telephone Encounter (Signed)
Pharmacy Patient Advocate Encounter  Received notification from [facility] that Prior Authorization for [name] (fremanezumab-vfrm) injection 225MG /1.5ML auto-injectors has been DENIED.  Full denial letter will be uploaded to the media tab. See denial reason below.   PA #/Case ID/Reference #: PA Case ID #: [id number]

## 2023-07-12 ENCOUNTER — Other Ambulatory Visit (HOSPITAL_COMMUNITY): Payer: Self-pay

## 2023-07-12 ENCOUNTER — Telehealth: Payer: Self-pay

## 2023-07-12 NOTE — Telephone Encounter (Signed)
Pharmacy Patient Advocate Encounter   Received notification from Physician's Office that prior authorization for Emgality 120MG /ML auto-injectors (migraine) is required/requested.   Insurance verification completed.   The patient is insured through [facility] .   Per test claim: PA required; PA submitted to above mentioned insurance via CoverMyMeds Key/confirmation #/EOC [id number] Status is pending

## 2023-07-18 NOTE — Telephone Encounter (Signed)
Pharmacy Patient Advocate Encounter  Received notification from [facility] that Prior Authorization for Emgality 120MG /ML auto-injectors (migraine) has been DENIED.  Full denial letter will be uploaded to the media tab. See denial reason below.    PA #/Case ID/Reference #: [id number]

## 2023-07-19 ENCOUNTER — Telehealth: Payer: Self-pay

## 2023-07-19 NOTE — Telephone Encounter (Signed)
Pharmacy Patient Advocate Encounter   Received notification from Physician's Office that prior authorization for [name] (fremanezumab-vfrm) injection 225MG /1.5ML auto-injectors is required/requested.   Insurance verification completed.   The patient is insured through [facility] .   Per test claim: PA required; PA submitted to above mentioned insurance via CoverMyMeds Key/confirmation #/EOC [id number] Status is pending

## 2023-07-19 NOTE — Telephone Encounter (Signed)
PA request has been Submitted. New Encounter created for follow up. For additional info see Pharmacy Prior Auth telephone encounter from 07/19/2023.

## 2023-07-20 ENCOUNTER — Other Ambulatory Visit (HOSPITAL_COMMUNITY): Payer: Self-pay

## 2023-07-20 NOTE — Telephone Encounter (Signed)
Pharmacy Patient Advocate Encounter   Received notification from Physician's Office that prior authorization for [name] (fremanezumab-vfrm) injection 225MG /1.5ML auto-injectors is required/requested.   Insurance verification completed.   The patient is insured through [facility] .   Per test claim: PA required; PA submitted to above mentioned insurance via CoverMyMeds Key/confirmation #/EOC [id number] Status is pending

## 2023-07-20 NOTE — Telephone Encounter (Signed)
Pharmacy Patient Advocate Encounter  Received notification from [facility] that Prior Authorization for [name] has been DENIED.  Full denial letter will be uploaded to the media tab. See denial reason below.   PA #/Case ID/Reference #: PA Case ID #: [id number]  They obviously did not even read the office note that I sent to them-I sent the clinicals from 01/16/2023 where [facility] was addressed and PT was having daily headaches. Will resubmit.

## 2023-07-20 NOTE — Telephone Encounter (Signed)
Pharmacy Patient Advocate Encounter  Received notification from [facility] that Prior Authorization for [name] (fremanezumab-vfrm) injection 225MG /1.5ML auto-injectors has been APPROVED from 07/20/2023 to 01/17/2024. Ran test claim, Copay is $24.98. This test claim was processed through [facility]- copay amounts may vary at other pharmacies due to pharmacy/plan contracts, or as the patient moves through the different stages of their insurance plan.   PA #/Case ID/Reference #: PA Case ID #: [id number]

## 2023-07-23 NOTE — Telephone Encounter (Signed)
I called pt and LVM with office number asking for call back. When she calls back, please read her my mychart message below.

## 2023-08-28 ENCOUNTER — Ambulatory Visit (INDEPENDENT_AMBULATORY_CARE_PROVIDER_SITE_OTHER): Payer: Managed Care, Other (non HMO) | Admitting: Family Medicine

## 2023-08-28 VITALS — BP 130/68 | HR 67 | Ht 62.0 in | Wt 97.0 lb

## 2023-08-28 DIAGNOSIS — F3341 Major depressive disorder, recurrent, in partial remission: Secondary | ICD-10-CM

## 2023-08-28 DIAGNOSIS — Z1231 Encounter for screening mammogram for malignant neoplasm of breast: Secondary | ICD-10-CM | POA: Diagnosis not present

## 2023-08-28 DIAGNOSIS — F5104 Psychophysiologic insomnia: Secondary | ICD-10-CM

## 2023-08-28 DIAGNOSIS — Z1211 Encounter for screening for malignant neoplasm of colon: Secondary | ICD-10-CM

## 2023-08-28 DIAGNOSIS — E538 Deficiency of other specified B group vitamins: Secondary | ICD-10-CM

## 2023-08-28 DIAGNOSIS — G43909 Migraine, unspecified, not intractable, without status migrainosus: Secondary | ICD-10-CM

## 2023-08-28 DIAGNOSIS — Z0000 Encounter for general adult medical examination without abnormal findings: Secondary | ICD-10-CM | POA: Diagnosis not present

## 2023-08-28 DIAGNOSIS — E559 Vitamin D deficiency, unspecified: Secondary | ICD-10-CM

## 2023-08-28 DIAGNOSIS — G4486 Cervicogenic headache: Secondary | ICD-10-CM

## 2023-08-28 DIAGNOSIS — F411 Generalized anxiety disorder: Secondary | ICD-10-CM

## 2023-08-28 MED ORDER — ESCITALOPRAM OXALATE 10 MG PO TABS: 10.0000 mg | ORAL_TABLET | Freq: Every day | ORAL | 3 refills | Status: DC

## 2023-08-28 MED ORDER — TRAZODONE HCL 50 MG PO TABS: 50.0000 mg | ORAL_TABLET | Freq: Every day | ORAL | 3 refills | Status: DC

## 2023-08-28 NOTE — Progress Notes (Signed)
 Subjective:    Patient ID: Carmen Logan, female    DOB: 04/17/1971, 52 y.o.   MRN: [id number]  Carmen Logan is a 52 y.o. female presenting on 08/28/2023 for Annual Exam   HPI  Discussed the use of AI scribe software for clinical note transcription with the patient, who gave verbal consent to proceed.  History of Present Illness         Here for Annual Physical and Lab Orders   Major depression recurrent, partial remission Anxiety Insomnia (shift work sleep disorder) Multiple stressors in past year+ with loss of family members Followed by therapist virtually Bishop JONELLE Borer MS LCSW She continues to take Lexapro  10mg  daily, overall doing much better on this than the previous Zoloft  She continues also on Trazodone  50mg  daily at bedtime. She sleeps on 3rd shift now has an abnormal schedule. Refills today  Migraines episodic vs Cervicogenic Headaches Followed by [facility] She has tried other meds before Triptan, Nurtec Failed Emgality  On [name]  3 inj = 4.5mL every 3 months for prevention AS NEEDED Also has Rizatriptan  ODT AS NEEDED She is going to request refill Medrol  dosepak AS NEEDED from Neuro   Gallbladder s/p cholecystectomy Now no more pain or gastritis, pain has resolved But has disrupted digestion IBS symptoms with both extremes    Difficulty gaining with with history of gastritis, diarrhea / chronic symptoms Trying to improve diet and protein intake In home garden   Health Maintenance: Declines Flu Shot and COVID booster   Cologuard 03/30/20 negative, repeat 3 years, approx 2024  Future Shingrix vaccine.       08/28/2023    1:16 PM 12/26/2022    1:11 PM 11/14/2022    1:21 PM  Depression screen PHQ 2/9  Decreased Interest 1 1 1   Down, Depressed, Hopeless 1 0 2  PHQ - 2 Score 2 1 3   Altered sleeping 1 1 1   Tired, decreased energy 1 0 0  Change in appetite 0 0 0  Feeling bad or failure about yourself  0 0 3  Trouble concentrating 0 0 0   Moving slowly or fidgety/restless 0 0 0  Suicidal thoughts 0 0 1  PHQ-9 Score 4 2 8   Difficult doing work/chores   Not difficult at all       08/28/2023    1:16 PM 12/26/2022    1:13 PM 11/14/2022    1:22 PM 08/02/2022    1:39 PM  GAD 7 : Generalized Anxiety Score  Nervous, Anxious, on Edge 1 0 2 1  Control/stop worrying 0 0 2 0  Worry too much - different things 0 0 2 0  Trouble relaxing 0 1 1 1   Restless 0 0 0 0  Easily annoyed or irritable 1 1 0 1  Afraid - awful might happen 0 0 0 0  Total GAD 7 Score 2 2 7 3   Anxiety Difficulty   Not difficult at all Not difficult at all     Past Medical History:  Diagnosis Date   Anxiety    Cervical dysplasia    Depression    Dyspareunia, female    Endometriosis    Facial skin lesion    HPV test positive    Migraine    seems weather related   Surgical menopause    Wears contact lenses    sometimes   Past Surgical History:  Procedure Laterality Date   BIOPSY N/A 11/16/2020   Procedure: BIOPSY;  Surgeon: Janalyn Keene NOVAK, MD;  Location: [facility];  Service: Endoscopy;  Laterality: N/A;   CERVICAL DISCECTOMY  03/17/2014   w/ fusion and plating   COLONOSCOPY WITH PROPOFOL  N/A 11/16/2020   Procedure: COLONOSCOPY WITH PROPOFOL ;  Surgeon: Janalyn Keene NOVAK, MD;  Location: [facility];  Service: Endoscopy;  Laterality: N/A;   CRYOTHERAPY     cervix   ESOPHAGOGASTRODUODENOSCOPY N/A 11/16/2020   Procedure: ESOPHAGOGASTRODUODENOSCOPY (EGD);  Surgeon: Janalyn Keene NOVAK, MD;  Location: [facility];  Service: Endoscopy;  Laterality: N/A;   TUBAL LIGATION  1999   VAGINAL HYSTERECTOMY  10/2009   Total vaginal hysterectomy, lso   Social History   Socioeconomic History   Marital status: Married    Spouse name: Not on file   Number of children: 2   Years of education: College   Highest education level: Associate degree: occupational, scientist, product/process development, or vocational program  Occupational History    Occupation: [employer]    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  Tobacco Use   Smoking status: Former    Current packs/day: 0.00    Types: Cigarettes    Quit date: 2015    Years since quitting: 10.0   Smokeless tobacco: Never   Tobacco comments:    Unsure duration    Pt vapes daily   Vaping Use   Vaping status: Every Day   Last attempt to quit: 02/25/2014  Substance and Sexual Activity   Alcohol use: Yes    Comment: occasional   Drug use: No   Sexual activity: Yes    Birth control/protection: Surgical  Other Topics Concern   Not on file  Social History Narrative   Caffeine: 4 cups/day   Social Drivers of Health   Financial Resource Strain: Medium Risk (08/28/2023)   Overall Financial Resource Strain (CARDIA)    Difficulty of Paying Living Expenses: Somewhat hard  Food Insecurity: Food Insecurity Present (08/28/2023)   Hunger Vital Sign    Worried About Running Out of Food in the Last Year: Sometimes true    Ran Out of Food in the Last Year: Sometimes true  Transportation Needs: No Transportation Needs (08/28/2023)   PRAPARE - Administrator, Civil Service (Medical): No    Lack of Transportation (Non-Medical): No  Physical Activity: Insufficiently Active (08/28/2023)   Exercise Vital Sign    Days of Exercise per Week: 7 days    Minutes of Exercise per Session: 20 min  Stress: No Stress Concern Present (08/28/2023)   [employer] of Occupational Health - Occupational Stress Questionnaire    Feeling of Stress : Only a little  Social Connections: Unknown (08/28/2023)   Social Connection and Isolation Panel [NHANES]    Frequency of Communication with Friends and Family: More than three times a week    Frequency of Social Gatherings with Friends and Family: Once a week    Attends Religious Services: Patient declined    Database Administrator or Organizations: No    Attends Engineer, Structural: Not on file    Marital Status: Married  Catering Manager Violence: Not on  file   Family History  Problem Relation Age of Onset   Heart disease Mother    Hypertension Mother    Thyroid disease Mother    Migraines Mother    Heart disease Father    Heart attack Father 41   Depression Sister    Diabetes Maternal Aunt    Migraines Maternal Aunt    Diabetes Maternal Grandmother    Cancer Neg Hx  Breast cancer Neg Hx    Current Outpatient Medications on File Prior to Visit  Medication Sig   acetaminophen  (TYLENOL ) 500 MG chewable tablet Chew 500 mg by mouth every 6 (six) hours as needed for pain.   [name]  225 MG/1.5ML SOAJ Inject 4.5 mLs as directed every 3 (three) months.   busPIRone  (BUSPAR ) 7.5 MG tablet TAKE 1 TABLET(7.5 MG) BY MOUTH TWICE DAILY AS NEEDED   Multiple Vitamin (MULTIVITAMIN WITH MINERALS) TABS tablet Take 1 tablet by mouth daily.   omeprazole  (PRILOSEC) 20 MG capsule Take 1 capsule (20 mg total) by mouth daily. (Patient taking differently: Take 20 mg by mouth daily as needed (acid reflux).)   ondansetron  (ZOFRAN -ODT) 8 MG disintegrating tablet Take 1 tablet (8 mg total) by mouth every 8 (eight) hours as needed.   rizatriptan  (MAXALT -MLT) 10 MG disintegrating tablet Take 1 tablet (10 mg total) by mouth as needed for migraine. May repeat in 2 hours if needed   methylPREDNISolone  (MEDROL  DOSEPAK) 4 MG TBPK tablet Take pills daily all together with food. Take the first dose (6 pills) as soon as possible. Take the rest each morning. For 6 days total 6-5-4-3-2-1. (Patient not taking: Reported on 08/28/2023)   No current facility-administered medications on file prior to visit.    Review of Systems  Constitutional:  Negative for activity change, appetite change, chills, diaphoresis, fatigue and fever.  HENT:  Negative for congestion and hearing loss.   Eyes:  Negative for visual disturbance.  Respiratory:  Negative for cough, chest tightness, shortness of breath and wheezing.   Cardiovascular:  Negative for chest pain, palpitations and leg  swelling.  Gastrointestinal:  Negative for abdominal pain, constipation, diarrhea, nausea and vomiting.  Genitourinary:  Negative for dysuria, frequency and hematuria.  Musculoskeletal:  Negative for arthralgias and neck pain.  Skin:  Negative for rash.  Neurological:  Negative for dizziness, weakness, light-headedness, numbness and headaches.  Hematological:  Negative for adenopathy.  Psychiatric/Behavioral:  Negative for behavioral problems, dysphoric mood and sleep disturbance.    Per HPI unless specifically indicated above     Objective:    BP 130/68   Pulse 67   Ht 5' 2 (1.575 m)   Wt 97 lb (44 kg)   SpO2 98%   BMI 17.74 kg/m   Wt Readings from Last 3 Encounters:  08/28/23 97 lb (44 kg)  04/24/23 108 lb (49 kg)  01/16/23 107 lb 9.6 oz (48.8 kg)    Physical Exam Vitals and nursing note reviewed.  Constitutional:      General: She is not in acute distress.    Appearance: She is well-developed. She is not diaphoretic.     Comments: Well-appearing, comfortable, cooperative  HENT:     Head: Normocephalic and atraumatic.  Eyes:     General:        Right eye: No discharge.        Left eye: No discharge.     Conjunctiva/sclera: Conjunctivae normal.     Pupils: Pupils are equal, round, and reactive to light.  Neck:     Thyroid: No thyromegaly.  Cardiovascular:     Rate and Rhythm: Normal rate and regular rhythm.     Pulses: Normal pulses.     Heart sounds: Normal heart sounds. No murmur heard. Pulmonary:     Effort: Pulmonary effort is normal. No respiratory distress.     Breath sounds: Normal breath sounds. No wheezing or rales.  Abdominal:     General: Bowel sounds are normal.  There is no distension.     Palpations: Abdomen is soft. There is no mass.     Tenderness: There is no abdominal tenderness.  Musculoskeletal:        General: No tenderness. Normal range of motion.     Cervical back: Normal range of motion and neck supple.     Right lower leg: No edema.      Left lower leg: No edema.     Comments: Upper / Lower Extremities: - Normal muscle tone, strength bilateral upper extremities 5/5, lower extremities 5/5  Lymphadenopathy:     Cervical: No cervical adenopathy.  Skin:    General: Skin is warm and dry.     Findings: No erythema or rash.  Neurological:     Mental Status: She is alert and oriented to person, place, and time.     Comments: Distal sensation intact to light touch all extremities  Psychiatric:        Mood and Affect: Mood normal.        Behavior: Behavior normal.        Thought Content: Thought content normal.     Comments: Well groomed, good eye contact, normal speech and thoughts     Results for orders placed or performed in visit on 01/16/23  Basic Metabolic Panel   Collection Time: 01/16/23  9:12 AM  Result Value Ref Range   Glucose 90 70 - 99 mg/dL   BUN 8 6 - 24 mg/dL   Creatinine, Ser 9.14 0.57 - 1.00 mg/dL   eGFR 84 >40 fO/fpw/8.26   BUN/Creatinine Ratio 9 9 - 23   Sodium 140 134 - 144 mmol/L   Potassium 4.5 3.5 - 5.2 mmol/L   Chloride 100 96 - 106 mmol/L   CO2 29 20 - 29 mmol/L   Calcium 8.9 8.7 - 10.2 mg/dL      Assessment & Plan:   Problem List Items Addressed This Visit     Cervicogenic headache   Relevant Medications   [name]  225 MG/1.5ML SOAJ   traZODone  (DESYREL ) 50 MG tablet   escitalopram  (LEXAPRO ) 10 MG tablet   Episodic migraine   Relevant Medications   [name]  225 MG/1.5ML SOAJ   traZODone  (DESYREL ) 50 MG tablet   escitalopram  (LEXAPRO ) 10 MG tablet   Other Relevant Orders   CBC with Differential/Platelet   GAD (generalized anxiety disorder)   Relevant Medications   traZODone  (DESYREL ) 50 MG tablet   escitalopram  (LEXAPRO ) 10 MG tablet   Insomnia   Major depressive disorder, recurrent episode, in partial remission (HCC)   Relevant Medications   traZODone  (DESYREL ) 50 MG tablet   escitalopram  (LEXAPRO ) 10 MG tablet   Other Relevant Orders   Hemoglobin A1c   Other Visit Diagnoses        Annual physical exam    -  Primary   Relevant Orders   Lipid panel   Hemoglobin A1c   CBC with Differential/Platelet   TSH   Comprehensive metabolic panel     Encounter for screening mammogram for malignant neoplasm of breast       Relevant Orders   MM 3D SCREENING MAMMOGRAM BILATERAL BREAST     Screening for colon cancer       Relevant Orders   Cologuard     Vitamin D  deficiency       Relevant Orders   VITAMIN D  25 Hydroxy (Vit-D Deficiency, Fractures)     Vitamin B12 deficiency       Relevant Orders  Vitamin B12     Severe episode of recurrent major depressive disorder, without psychotic features (HCC)       Relevant Medications   traZODone  (DESYREL ) 50 MG tablet   escitalopram  (LEXAPRO ) 10 MG tablet        Updated Health Maintenance information Labs ordered through [facility] Encouraged improvement to lifestyle with diet and exercise Goal of weight loss   Episodic Migraines Followed by [facility] Dr Ines Cervicogenic trigger Improved with [name] . Patient reports fewer headache days and improved quality of life. -Continue [name]  3 injections every 3 months. -Ensure patient has access to rizatriptan  for acute episodes. She will request refills from Neuro on Medrol  dose pak AS NEEDED   Cervical Arthritis Patient reports pain at the site of previous plate and screw placement. See above migraines  History of Gastritis -Continue current management strategy and monitor symptoms.  Breast Cancer Screening Patient overdue for mammogram. -Schedule mammogram at [facility] as soon as possible.  Colon Cancer Screening Patient due for Cologuard test. -Order Cologuard test.  Vitamin Deficiency Patient requests vitamin B and D levels to be checked. -Order labs for vitamin B and D levels, thyroid, lipid, A1c, blood count, and chemistry.  Medication Refills Patient requests refills for Trazodone  and Lexapro . -Refill Trazodone  and Lexapro  for the year.   Orders Placed This  Encounter  Procedures   MM 3D SCREENING MAMMOGRAM BILATERAL BREAST    Standing Status:   Future    Expiration Date:   08/27/2024    Reason for Exam (SYMPTOM  OR DIAGNOSIS REQUIRED):   Screening bilateral 3D Mammogram Tomo    Preferred imaging location?:   [facility]   Cologuard   Lipid panel    Has the patient fasted?:   Yes   Hemoglobin A1c   CBC with Differential/Platelet   TSH   VITAMIN D  25 Hydroxy (Vit-D Deficiency, Fractures)   Vitamin B12   Comprehensive metabolic panel    Has the patient fasted?:   Yes    Meds ordered this encounter  Medications   traZODone  (DESYREL ) 50 MG tablet    Sig: Take 1 tablet (50 mg total) by mouth at bedtime.    Dispense:  90 tablet    Refill:  3   escitalopram  (LEXAPRO ) 10 MG tablet    Sig: Take 1 tablet (10 mg total) by mouth daily.    Dispense:  90 tablet    Refill:  3     Follow up plan: Return in about 8 months (around 04/27/2024) for 8 month Annual Physical ([facility] orders after visit).  Marsa Officer, DO [facility] 08/28/2023, 1:24 PM

## 2023-08-28 NOTE — Patient Instructions (Addendum)
 Thank you for coming to the office today.  For Mammogram screening for breast cancer   Call the Imaging Center below anytime to schedule your own appointment now that order has been placed.  [facility] at [facility] [address], [address] [place], [place] [address] Phone: [phone]  ------------------  [facility] orders  -----------------  Refilled Lexapro  and Trazodone  for 90 day +3 refills  ------------------  Ordered the Cologuard (home kit) test for colon cancer screening. Stay tuned for further updates.  It will be shipped to you directly. If not received in 2-4 weeks, call us  or the company.   If you send it back and no results are received in 2-4 weeks, call us  or the company as well!   Colon Cancer Screening: - For all adults age 66+ routine colon cancer screening is highly recommended.     - Recent guidelines from American Cancer Society recommend starting age of 57 - Early detection of colon cancer is important, because often there are no warning signs or symptoms, also if found early usually it can be cured. Late stage is hard to treat.   - If Cologuard is NEGATIVE, then it is good for 3 years before next due - If Cologuard is POSITIVE, then it is strongly advised to get a Colonoscopy, which allows the GI doctor to locate the source of the cancer or polyp (even very early stage) and treat it by removing it. ------------------------- Follow instructions to collect sample, you may call the company for any help or questions, 24/7 telephone support at [phone].    Please schedule a Follow-up Appointment to: Return in about 8 months (around 04/27/2024) for 8 month Annual Physical ([facility] orders after visit).  If you have any other questions or concerns, please feel free to call the office or send a message through MyChart. You may also schedule an earlier appointment if necessary.  Additionally, you may be  receiving a survey about your experience at our office within a few days to 1 week by e-mail or mail. We value your feedback.  Marsa Officer, DO [facility], [place]

## 2023-09-01 LAB — COMPREHENSIVE METABOLIC PANEL
ALT: 6 [IU]/L (ref 0–32)
AST: 13 [IU]/L (ref 0–40)
Albumin: 4.9 g/dL (ref 3.9–4.9)
Alkaline Phosphatase: 75 [IU]/L (ref 44–121)
BUN/Creatinine Ratio: 14 (ref 9–23)
BUN: 11 mg/dL (ref 6–24)
Bilirubin Total: 0.4 mg/dL (ref 0.0–1.2)
CO2: 25 mmol/L (ref 20–29)
Calcium: 9.7 mg/dL (ref 8.7–10.2)
Chloride: 99 mmol/L (ref 96–106)
Creatinine, Ser: 0.8 mg/dL (ref 0.57–1.00)
Globulin, Total: 2.6 g/dL (ref 1.5–4.5)
Glucose: 107 mg/dL — ABNORMAL HIGH (ref 70–99)
Potassium: 4.3 mmol/L (ref 3.5–5.2)
Sodium: 141 mmol/L (ref 134–144)
Total Protein: 7.5 g/dL (ref 6.0–8.5)
eGFR: 90 mL/min/{1.73_m2} (ref >59)

## 2023-09-01 LAB — CBC WITH DIFFERENTIAL/PLATELET
Basophils Absolute: 0.1 10*3/uL (ref 0.0–0.2)
Basos: 1 %
EOS (ABSOLUTE): 0.1 10*3/uL (ref 0.0–0.4)
Eos: 2 %
Hematocrit: 42.4 % (ref 34.0–46.6)
Hemoglobin: 14 g/dL (ref 11.1–15.9)
Immature Grans (Abs): 0 10*3/uL (ref 0.0–0.1)
Immature Granulocytes: 0 %
Lymphocytes Absolute: 1.9 10*3/uL (ref 0.7–3.1)
Lymphs: 37 %
MCH: 29.5 pg (ref 26.6–33.0)
MCHC: 33 g/dL (ref 31.5–35.7)
MCV: 90 fL (ref 79–97)
Monocytes Absolute: 0.3 10*3/uL (ref 0.1–0.9)
Monocytes: 7 %
Neutrophils Absolute: 2.8 10*3/uL (ref 1.4–7.0)
Neutrophils: 53 %
Platelets: 259 10*3/uL (ref 150–450)
RBC: 4.74 x10E6/uL (ref 3.77–5.28)
RDW: 12.5 % (ref 11.7–15.4)
WBC: 5.1 10*3/uL (ref 3.4–10.8)

## 2023-09-01 LAB — LIPID PANEL
Chol/HDL Ratio: 2.8 {ratio} (ref 0.0–4.4)
Cholesterol, Total: 183 mg/dL (ref 100–199)
HDL: 65 mg/dL (ref >39)
LDL Chol Calc (NIH): 103 mg/dL — ABNORMAL HIGH (ref 0–99)
Triglycerides: 84 mg/dL (ref 0–149)
VLDL Cholesterol Cal: 15 mg/dL (ref 5–40)

## 2023-09-01 LAB — HEMOGLOBIN A1C
Est. average glucose Bld gHb Est-mCnc: 100 mg/dL
Hgb A1c MFr Bld: 5.1 % (ref 4.8–5.6)

## 2023-09-01 LAB — VITAMIN D 25 HYDROXY (VIT D DEFICIENCY, FRACTURES): Vit D, 25-Hydroxy: 45.4 ng/mL (ref 30.0–100.0)

## 2023-09-01 LAB — TSH: TSH: 3.91 u[IU]/mL (ref 0.450–4.500)

## 2023-09-01 LAB — VITAMIN B12: Vitamin B-12: 439 pg/mL (ref 232–1245)

## 2023-09-03 ENCOUNTER — Other Ambulatory Visit: Payer: Self-pay | Admitting: Neurology

## 2023-09-03 DIAGNOSIS — G5603 Carpal tunnel syndrome, bilateral upper limbs: Secondary | ICD-10-CM

## 2023-10-01 LAB — COLOGUARD: COLOGUARD: NEGATIVE

## 2023-10-02 ENCOUNTER — Encounter: Payer: Self-pay | Admitting: Family Medicine

## 2024-01-22 ENCOUNTER — Other Ambulatory Visit: Payer: Self-pay | Admitting: Neurology

## 2024-01-22 DIAGNOSIS — G43909 Migraine, unspecified, not intractable, without status migrainosus: Secondary | ICD-10-CM

## 2024-02-26 ENCOUNTER — Other Ambulatory Visit: Payer: Self-pay | Admitting: Medical Genetics

## 2024-03-03 ENCOUNTER — Ambulatory Visit
Admission: RE | Admit: 2024-03-03 | Discharge: 2024-03-03 | Disposition: A | Discharge status: Home / Self Care | Payer: Self-pay | Source: Ambulatory Visit | Attending: Family Medicine | Admitting: Family Medicine

## 2024-03-03 ENCOUNTER — Other Ambulatory Visit
Admission: RE | Admit: 2024-03-03 | Discharge: 2024-03-03 | Disposition: A | Discharge status: Home / Self Care | Payer: Self-pay | Source: Ambulatory Visit | Attending: Family Medicine | Admitting: Family Medicine

## 2024-03-03 DIAGNOSIS — Z1231 Encounter for screening mammogram for malignant neoplasm of breast: Secondary | ICD-10-CM | POA: Diagnosis present

## 2024-03-14 LAB — GENECONNECT MOLECULAR SCREEN: Genetic Analysis Overall Interpretation: NEGATIVE

## 2024-04-30 ENCOUNTER — Ambulatory Visit: Payer: Self-pay | Admitting: Family Medicine

## 2024-04-30 VITALS — BP 122/78 | HR 65 | Ht 62.0 in | Wt 104.5 lb

## 2024-04-30 DIAGNOSIS — F3342 Major depressive disorder, recurrent, in full remission: Secondary | ICD-10-CM

## 2024-04-30 DIAGNOSIS — F5104 Psychophysiologic insomnia: Secondary | ICD-10-CM

## 2024-04-30 DIAGNOSIS — E538 Deficiency of other specified B group vitamins: Secondary | ICD-10-CM

## 2024-04-30 DIAGNOSIS — F411 Generalized anxiety disorder: Secondary | ICD-10-CM | POA: Diagnosis not present

## 2024-04-30 DIAGNOSIS — E559 Vitamin D deficiency, unspecified: Secondary | ICD-10-CM

## 2024-04-30 DIAGNOSIS — Z0000 Encounter for general adult medical examination without abnormal findings: Secondary | ICD-10-CM

## 2024-04-30 NOTE — Patient Instructions (Addendum)
 Thank you for coming to the office today.  Let me know when ready for refills on Trazodone , when hits 0 refills, message me and I can re order for 1 year.   Future consideration Coronary Calcium Score Cardiac CT Scan. This is a screening test for patients aged 51-50+ with cardiovascular risk factors or who are healthy but would be interested in Cardiovascular Screening for heart disease. Even if there is a family history of heart disease, this imaging can be useful. Typically it can be done every 5+ years or at a different timeline we agree on  The scan will look at the chest and mainly focus on the heart and identify early signs of calcium build up or blockages within the heart arteries. It is not 100% accurate for identifying blockages or heart disease, but it is useful to help us  predict who may have some early changes or be at risk in the future for a heart attack or cardiovascular problem.  The results are reviewed by a Cardiologist and they will document the results. It should become available on MyChart. Typically the results are divided into percentiles based on other patients of the same demographic and age. So it will compare your risk to others similar to you. If you have a higher score >99 or higher percentile >75%tile, it is recommended to consider Statin cholesterol therapy and or referral to Cardiologist. I will try to help explain your results and if we have questions we can contact the Cardiologist.  You will be contacted for scheduling. Usually it is done at any imaging facility through [facility], [facility] or [facility].  The cost is $99 flat fee total and it does not go through insurance, so no authorization is required.   Please schedule a Follow-up Appointment to: Return for 1 year Annual Physical ([facility] orders after).  If you have any other questions or concerns, please feel free to call the office or send a message through MyChart. You  may also schedule an earlier appointment if necessary.  Additionally, you may be receiving a survey about your experience at our office within a few days to 1 week by e-mail or mail. We value your feedback.  Marsa Officer, DO [facility], [place]

## 2024-04-30 NOTE — Progress Notes (Signed)
 Subjective:    Patient ID: Carmen Logan, female    DOB: 1971/08/08, 53 y.o.   MRN: [id number]  Carmen Logan is a 53 y.o. female presenting on 04/30/2024 for Annual Exam   HPI  Discussed the use of AI scribe software for clinical note transcription with the patient, who gave verbal consent to proceed.  History of Present Illness   Carmen Logan is a 53 year old female who presents for an annual physical exam.  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 , used at least six nights per week when working third shift - Trazodone  50mg  nightly on work nights provides effective sleep regulation taking 6 nights per week - No longer taking Lexapro  for mood; discontinued several months ago and currently stable without it  Cervicogenic headache History migraines Followed by [facility]rology for migraines HAs - History of migraines identified as cervicogenic in origin - Significant improvement with use of a cervical pillow for neck support - On Maxalt  AS NEEDED if need - Failed med list Triptans, Nurtec, Emgality , [name]   Gastrointestinal symptoms post-cholecystectomy - History of cholecystectomy - Ongoing fluctuations in digestive health since surgery - Gradual improvement in digestive regularity over time Difficulty gaining with with history of gastritis, diarrhea / chronic symptoms Trying to improve diet and protein intake  Preventive health and laboratory testing - Plans to obtain routine blood work through [employer] - Interested in additional laboratory evaluation for Vitamin D  and B12 levels      Health Maintenance:  Mammogram 03/03/24 negative  Cologuard negative 09/19/23, repeat 3 years, 2028  Future Shingrix vaccine, Pneumonia prevnar-20 vaccine  Past Surgical History:  Procedure Laterality Date    BIOPSY N/A 11/16/2020   Procedure: BIOPSY;  Surgeon: Janalyn Keene NOVAK, MD;  Location: [facility];  Service: Endoscopy;  Laterality: N/A;   CERVICAL DISCECTOMY  03/17/2014   w/ fusion and plating   COLONOSCOPY WITH PROPOFOL  N/A 11/16/2020   Procedure: COLONOSCOPY WITH PROPOFOL ;  Surgeon: Janalyn Keene NOVAK, MD;  Location: [facility];  Service: Endoscopy;  Laterality: N/A;   CRYOTHERAPY     cervix   ESOPHAGOGASTRODUODENOSCOPY N/A 11/16/2020   Procedure: ESOPHAGOGASTRODUODENOSCOPY (EGD);  Surgeon: Janalyn Keene NOVAK, MD;  Location: [facility];  Service: Endoscopy;  Laterality: N/A;   TUBAL LIGATION  1999   VAGINAL HYSTERECTOMY  10/2009   Total vaginal hysterectomy, lso        04/30/2024    6:22 PM 08/28/2023    1:16 PM 12/26/2022    1:11 PM  Depression screen PHQ 2/9  Decreased Interest 0 1 1  Down, Depressed, Hopeless 0 1 0  PHQ - 2 Score 0 2 1  Altered sleeping 0 1 1  Tired, decreased energy 0 1 0  Change in appetite 0 0 0  Feeling bad or failure about yourself  0 0 0  Trouble concentrating 0 0 0  Moving slowly or fidgety/restless 0 0 0  Suicidal thoughts 0 0 0  PHQ-9 Score 0 4 2  Difficult doing work/chores Not difficult at all         04/30/2024    1:13 PM 08/28/2023    1:16 PM 12/26/2022    1:13 PM 11/14/2022    1:22 PM  GAD 7 : Generalized Anxiety Score  Nervous, Anxious, on Edge 0 1 0 2  Control/stop worrying 0  0 0 2  Worry too much - different things 0 0 0 2  Trouble relaxing 0 0 1 1  Restless 0 0 0 0  Easily annoyed or irritable 0 1 1 0  Afraid - awful might happen 0 0 0 0  Total GAD 7 Score 0 2 2 7   Anxiety Difficulty    Not difficult at all     Past Medical History:  Diagnosis Date   Anxiety    Cervical dysplasia    Depression    Dyspareunia, female    Endometriosis    Facial skin lesion    HPV test positive    Migraine    seems weather related   Surgical menopause    Wears contact lenses    sometimes   Past  Surgical History:  Procedure Laterality Date   BIOPSY N/A 11/16/2020   Procedure: BIOPSY;  Surgeon: Janalyn Keene NOVAK, MD;  Location: [facility];  Service: Endoscopy;  Laterality: N/A;   CERVICAL DISCECTOMY  03/17/2014   w/ fusion and plating   COLONOSCOPY WITH PROPOFOL  N/A 11/16/2020   Procedure: COLONOSCOPY WITH PROPOFOL ;  Surgeon: Janalyn Keene NOVAK, MD;  Location: [facility];  Service: Endoscopy;  Laterality: N/A;   CRYOTHERAPY     cervix   ESOPHAGOGASTRODUODENOSCOPY N/A 11/16/2020   Procedure: ESOPHAGOGASTRODUODENOSCOPY (EGD);  Surgeon: Janalyn Keene NOVAK, MD;  Location: [facility];  Service: Endoscopy;  Laterality: N/A;   TUBAL LIGATION  1999   VAGINAL HYSTERECTOMY  10/2009   Total vaginal hysterectomy, lso   Social History   Socioeconomic History   Marital status: Married    Spouse name: Not on file   Number of children: 2   Years of education: College   Highest education level: Associate degree: occupational, Scientist, product/process development, or vocational program  Occupational History   Occupation: [employer]    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  Tobacco Use   Smoking status: Former    Current packs/day: 0.00    Types: Cigarettes    Quit date: 2015    Years since quitting: 10.6   Smokeless tobacco: Never   Tobacco comments:    Unsure duration    Pt vapes daily   Vaping Use   Vaping status: Every Day   Last attempt to quit: 02/25/2014  Substance and Sexual Activity   Alcohol use: Yes    Comment: occasional   Drug use: No   Sexual activity: Yes    Birth control/protection: Surgical  Other Topics Concern   Not on file  Social History Narrative   Caffeine: 4 cups/day   Social Drivers of Corporate investment banker Strain: Low Risk  (04/30/2024)   Overall Financial Resource Strain (CARDIA)    Difficulty of Paying Living Expenses: Not very hard  Food Insecurity: No Food Insecurity (04/30/2024)   Hunger Vital Sign    Worried About Running Out of Food in the Last Year:  Never true    Ran Out of Food in the Last Year: Never true  Transportation Needs: No Transportation Needs (04/30/2024)   PRAPARE - Administrator, Civil Service (Medical): No    Lack of Transportation (Non-Medical): No  Physical Activity: Insufficiently Active (04/30/2024)   Exercise Vital Sign    Days of Exercise per Week: 4 days    Minutes of Exercise per Session: 30 min  Stress: No Stress Concern Present (04/30/2024)   [employer] of Occupational Health - Occupational Stress Questionnaire    Feeling of Stress: Not at all  Social Connections: Moderately Isolated (04/30/2024)   Social Connection and Isolation Panel    Frequency of Communication with Friends and Family: More than three times a week    Frequency of Social Gatherings with Friends and Family: Once a week    Attends Religious Services: Patient declined    Database administrator or Organizations: No    Attends Engineer, structural: Not on file    Marital Status: Married  Catering manager Violence: Not on file   Family History  Problem Relation Age of Onset   Heart disease Mother    Hypertension Mother    Thyroid disease Mother    Migraines Mother    Heart disease Father    Heart attack Father 10   Depression Sister    Diabetes Maternal Aunt    Migraines Maternal Aunt    Diabetes Maternal Grandmother    Cancer Neg Hx    Breast cancer Neg Hx    Current Outpatient Medications on File Prior to Visit  Medication Sig   acetaminophen  (TYLENOL ) 500 MG chewable tablet Chew 500 mg by mouth every 6 (six) hours as needed for pain.   Multiple Vitamin (MULTIVITAMIN WITH MINERALS) TABS tablet Take 1 tablet by mouth daily.   ondansetron  (ZOFRAN -ODT) 8 MG disintegrating tablet Take 1 tablet (8 mg total) by mouth every 8 (eight) hours as needed.   rizatriptan  (MAXALT -MLT) 10 MG disintegrating tablet Take 1 tablet (10 mg total) by mouth as needed for migraine. May repeat in 2 hours if needed   traZODone   (DESYREL ) 50 MG tablet Take 1 tablet (50 mg total) by mouth at bedtime.   No current facility-administered medications on file prior to visit.    Review of Systems  Constitutional:  Negative for activity change, appetite change, chills, diaphoresis, fatigue and fever.  HENT:  Negative for congestion and hearing loss.   Eyes:  Negative for visual disturbance.  Respiratory:  Negative for cough, chest tightness, shortness of breath and wheezing.   Cardiovascular:  Negative for chest pain, palpitations and leg swelling.  Gastrointestinal:  Negative for abdominal pain, constipation, diarrhea, nausea and vomiting.  Genitourinary:  Negative for dysuria, frequency and hematuria.  Musculoskeletal:  Negative for arthralgias and neck pain.  Skin:  Negative for rash.  Neurological:  Negative for dizziness, weakness, light-headedness, numbness and headaches.  Hematological:  Negative for adenopathy.  Psychiatric/Behavioral:  Negative for behavioral problems, dysphoric mood and sleep disturbance.    Per HPI unless specifically indicated above     Objective:    BP 122/78 (BP Location: Left Arm, Patient Position: Sitting, Cuff Size: Normal)   Pulse 65   Ht 5' 2 (1.575 m)   Wt 104 lb 8 oz (47.4 kg)   SpO2 97%   BMI 19.11 kg/m   Wt Readings from Last 3 Encounters:  04/30/24 104 lb 8 oz (47.4 kg)  08/28/23 97 lb (44 kg)  04/24/23 108 lb (49 kg)    Physical Exam Vitals and nursing note reviewed.  Constitutional:      General: She is not in acute distress.    Appearance: She is well-developed. She is not diaphoretic.     Comments: Well-appearing, comfortable, cooperative  HENT:     Head: Normocephalic and atraumatic.  Eyes:     General:        Right eye: No discharge.        Left eye: No discharge.     Conjunctiva/sclera: Conjunctivae normal.     Pupils: Pupils are  equal, round, and reactive to light.  Neck:     Thyroid: No thyromegaly.     Vascular: No carotid bruit.   Cardiovascular:     Rate and Rhythm: Normal rate and regular rhythm.     Pulses: Normal pulses.     Heart sounds: Normal heart sounds. No murmur heard. Pulmonary:     Effort: Pulmonary effort is normal. No respiratory distress.     Breath sounds: Normal breath sounds. No wheezing or rales.  Abdominal:     General: Bowel sounds are normal. There is no distension.     Palpations: Abdomen is soft. There is no mass.     Tenderness: There is no abdominal tenderness.  Musculoskeletal:        General: No tenderness. Normal range of motion.     Cervical back: Normal range of motion and neck supple.     Right lower leg: No edema.     Comments: Upper / Lower Extremities: - Normal muscle tone, strength bilateral upper extremities 5/5, lower extremities 5/5  Lymphadenopathy:     Cervical: No cervical adenopathy.  Skin:    General: Skin is warm and dry.     Findings: No erythema or rash.  Neurological:     Mental Status: She is alert and oriented to person, place, and time.     Comments: Distal sensation intact to light touch all extremities  Psychiatric:        Mood and Affect: Mood normal.        Behavior: Behavior normal.        Thought Content: Thought content normal.     Comments: Well groomed, good eye contact, normal speech and thoughts     Results for orders placed or performed during the hospital encounter of 03/03/24  GeneConnect Molecular Screen - Blood ([facility])   Collection Time: 03/03/24  8:51 AM  Result Value Ref Range   Genetic Analysis Overall Interpretation Negative    Genetic Disease Assessed      This is a screening test and does not detect all pathogenic or likely pathogenic variant(s) in the tested genes; diagnostic testing is recommended for individuals with a personal or family history of heart disease or hereditary cancer. [employer] Tier One  Population Screen is a screening test that analyzes 11 genes related to hereditary breast and ovarian cancer  (HBOC) syndrome, Lynch syndrome, and familial hypercholesterolemia. This test only reports clinically significant pathogenic and likely  pathogenic variants but does not report variants of uncertain significance (VUS). In addition, analysis of the PMS2 gene excludes exons 11-15, which overlap with a known pseudogene (PMS2CL).    Genetic Analysis Report      No pathogenic or likely pathogenic variants were detected in the genes analyzed by this test.Genetic test results should be interpreted in the context of an individual's personal medical and family history. Alteration to medical management is NOT  recommended based solely on this result. Clinical correlation is advised.Additional Considerations- This is a screening test; individuals may still carry pathogenic or likely pathogenic variant(s) in the tested genes that are not detected by this test.-  For individuals at risk for these or other related conditions based on factors including personal or family history, diagnostic testing is recommended.- The absence of pathogenic or likely pathogenic variant(s) in the analyzed genes, while reassuring,  does not eliminate the possibility of a hereditary condition; there are other variants and genes associated with heart disease and hereditary cancer that are not included  in this test.    Genes Tested See Notes    Disclaimer See Notes    Sequencing Location See Notes    Interpretation Methods and Limitations See Notes       Assessment & Plan:   Problem List Items Addressed This Visit     GAD (generalized anxiety disorder)   Insomnia   Recurrent major depression in complete remission (HCC)   Other Visit Diagnoses       Annual physical exam    -  Primary   Relevant Orders   TSH   Lipid panel   Hemoglobin A1c   CBC with Differential/Platelet   Comprehensive metabolic panel with GFR   T4, free     Vitamin B12 deficiency       Relevant Orders   Vitamin B12     Vitamin D  deficiency        Relevant Orders   VITAMIN D  25 Hydroxy (Vit-D Deficiency, Fractures)        Updated Health Maintenance information [facility] orders printed, given to patient today Encouraged improvement to lifestyle with diet and exercise  Shift work sleep disorder Managed with trazodone , effective for sleep aid. Works third shift, contributing to disorder. Prescription sufficient until December. - Continue trazodone  as needed for sleep. - Refill trazodone  through MyChart when current prescription runs out.  Headaches, cervicogenic Cervicogenic migraines improved with cervical pillow. Maxalt  available for acute relief AS NEEDED Followed by Neurology [facility] - Continue use of cervical pillow for migraine prevention. - Use Maxalt  (rizatriptan ) as needed for acute migraine relief.  Adult Wellness Visit Annual wellness visit completed. Blood work pending. Mammogram and colon screening negative. Deferred pneumonia and shingles vaccines. Hepatitis B vaccine likely received. Declined heart scan. - Order blood work including basic panel, Vitamin D , and B12. - Schedule annual physical for September next year. - Discuss pneumonia and shingles vaccines at future visits. - Consider CT Calcium Score heart scan if she expresses interest in the future.         Orders Placed This Encounter  Procedures   TSH   Lipid panel    Has the patient fasted?:   Yes   Hemoglobin A1c   CBC with Differential/Platelet   Comprehensive metabolic panel with GFR    Has the patient fasted?:   Yes   VITAMIN D  25 Hydroxy (Vit-D Deficiency, Fractures)   Vitamin B12   T4, free    No orders of the defined types were placed in this encounter.    Follow up plan: Return for 1 year Annual Physical ([facility] orders after).  Marsa Officer, DO [facility] 04/30/2024, 1:22 PM

## 2024-05-02 ENCOUNTER — Ambulatory Visit: Payer: Self-pay | Admitting: Family Medicine

## 2024-05-02 LAB — LIPID PANEL
Chol/HDL Ratio: 1.9 ratio (ref 0.0–4.4)
Cholesterol, Total: 217 mg/dL — ABNORMAL HIGH (ref 100–199)
HDL: 113 mg/dL (ref >39)
LDL Chol Calc (NIH): 90 mg/dL (ref 0–99)
Triglycerides: 84 mg/dL (ref 0–149)
VLDL Cholesterol Cal: 14 mg/dL (ref 5–40)

## 2024-05-02 LAB — CBC WITH DIFFERENTIAL/PLATELET
Basophils Absolute: 0.1 x10E3/uL (ref 0.0–0.2)
Basos: 1 %
EOS (ABSOLUTE): 0.1 x10E3/uL (ref 0.0–0.4)
Eos: 1 %
Hematocrit: 43.1 % (ref 34.0–46.6)
Hemoglobin: 13.9 g/dL (ref 11.1–15.9)
Immature Grans (Abs): 0 x10E3/uL (ref 0.0–0.1)
Immature Granulocytes: 0 %
Lymphocytes Absolute: 2.1 x10E3/uL (ref 0.7–3.1)
Lymphs: 27 %
MCH: 30.5 pg (ref 26.6–33.0)
MCHC: 32.3 g/dL (ref 31.5–35.7)
MCV: 95 fL (ref 79–97)
Monocytes Absolute: 0.4 x10E3/uL (ref 0.1–0.9)
Monocytes: 6 %
Neutrophils Absolute: 5 x10E3/uL (ref 1.4–7.0)
Neutrophils: 65 %
Platelets: 252 x10E3/uL (ref 150–450)
RBC: 4.56 x10E6/uL (ref 3.77–5.28)
RDW: 13.1 % (ref 11.7–15.4)
WBC: 7.7 x10E3/uL (ref 3.4–10.8)

## 2024-05-02 LAB — COMPREHENSIVE METABOLIC PANEL WITH GFR
ALT: 13 IU/L (ref 0–32)
AST: 20 IU/L (ref 0–40)
Albumin: 4.9 g/dL (ref 3.9–4.9)
Alkaline Phosphatase: 96 IU/L (ref 44–121)
BUN/Creatinine Ratio: 9 (ref 9–23)
BUN: 9 mg/dL (ref 6–24)
Bilirubin Total: 1.1 mg/dL (ref 0.0–1.2)
CO2: 24 mmol/L (ref 20–29)
Calcium: 9.9 mg/dL (ref 8.7–10.2)
Chloride: 102 mmol/L (ref 96–106)
Creatinine, Ser: 1 mg/dL (ref 0.57–1.00)
Globulin, Total: 2.8 g/dL (ref 1.5–4.5)
Glucose: 102 mg/dL — ABNORMAL HIGH (ref 70–99)
Potassium: 4.5 mmol/L (ref 3.5–5.2)
Sodium: 144 mmol/L (ref 134–144)
Total Protein: 7.7 g/dL (ref 6.0–8.5)
eGFR: 69 mL/min/1.73 (ref >59)

## 2024-05-02 LAB — VITAMIN D 25 HYDROXY (VIT D DEFICIENCY, FRACTURES): Vit D, 25-Hydroxy: 30.1 ng/mL (ref 30.0–100.0)

## 2024-05-02 LAB — VITAMIN B12: Vitamin B-12: 283 pg/mL (ref 232–1245)

## 2024-05-02 LAB — TSH: TSH: 4.47 u[IU]/mL (ref 0.450–4.500)

## 2024-05-02 LAB — HEMOGLOBIN A1C
Est. average glucose Bld gHb Est-mCnc: 97 mg/dL
Hgb A1c MFr Bld: 5 % (ref 4.8–5.6)

## 2024-05-02 LAB — T4, FREE: Free T4: 1.19 ng/dL (ref 0.82–1.77)

## 2024-08-20 ENCOUNTER — Encounter: Payer: Self-pay | Admitting: Family Medicine

## 2024-08-22 ENCOUNTER — Other Ambulatory Visit: Payer: Self-pay

## 2024-08-22 DIAGNOSIS — F3341 Major depressive disorder, recurrent, in partial remission: Secondary | ICD-10-CM

## 2024-08-22 MED ORDER — TRAZODONE HCL 50 MG PO TABS: 50.0000 mg | ORAL_TABLET | Freq: Every day | ORAL | 3 refills | Status: AC

## 2024-08-23 ENCOUNTER — Other Ambulatory Visit: Payer: Self-pay | Admitting: Family Medicine

## 2024-08-23 DIAGNOSIS — F3341 Major depressive disorder, recurrent, in partial remission: Secondary | ICD-10-CM

## 2024-08-25 NOTE — Telephone Encounter (Signed)
 Requested Prescriptions  Refused Prescriptions Disp Refills   traZODone  (DESYREL ) 50 MG tablet [Pharmacy Med Name: TRAZODONE  50MG  TABLETS] 90 tablet 3    Sig: TAKE 1 TABLET(50 MG) BY MOUTH AT BEDTIME     Psychiatry: Antidepressants - Serotonin Modulator Passed - 08/25/2024  4:38 PM      Passed - Completed PHQ-2 or PHQ-9 in the last 360 days      Passed - Valid encounter within last 6 months    Recent Outpatient Visits           3 months ago Annual physical exam   [facility] [place], Marsa PARAS, DO

## 2024-08-28 ENCOUNTER — Other Ambulatory Visit: Payer: Self-pay | Admitting: Family Medicine

## 2024-08-28 DIAGNOSIS — F3341 Major depressive disorder, recurrent, in partial remission: Secondary | ICD-10-CM

## 2024-08-29 NOTE — Telephone Encounter (Signed)
 Discontinued on 04/30/24.  Requested Prescriptions  Pending Prescriptions Disp Refills   escitalopram  (LEXAPRO ) 10 MG tablet [Pharmacy Med Name: ESCITALOPRAM  10MG  TABLETS] 90 tablet 3    Sig: TAKE 1 TABLET(10 MG) BY MOUTH DAILY     Psychiatry:  Antidepressants - SSRI Passed - 08/29/2024 12:17 PM      Passed - Completed PHQ-2 or PHQ-9 in the last 360 days      Passed - Valid encounter within last 6 months    Recent Outpatient Visits           4 months ago Annual physical exam   [facility] [place], Marsa PARAS, DO

## 2025-05-06 ENCOUNTER — Encounter: Admitting: Family Medicine
